# Patient Record
Sex: Female | Born: 1965 | Race: Black or African American | Hispanic: No | Marital: Married | Smoking: Never smoker
Health system: Southern US, Community
[De-identification: ages and names within clinical notes are randomized; demographics above are authoritative.]

## PROBLEM LIST (undated history)

## (undated) DIAGNOSIS — E119 Type 2 diabetes mellitus without complications: Secondary | ICD-10-CM

## (undated) DIAGNOSIS — J80 Acute respiratory distress syndrome: Secondary | ICD-10-CM

## (undated) DIAGNOSIS — U071 COVID-19: Secondary | ICD-10-CM

## (undated) DIAGNOSIS — J9621 Acute and chronic respiratory failure with hypoxia: Secondary | ICD-10-CM

## (undated) DIAGNOSIS — R6520 Severe sepsis without septic shock: Secondary | ICD-10-CM

## (undated) DIAGNOSIS — A419 Sepsis, unspecified organism: Secondary | ICD-10-CM

---

## 2019-04-10 ENCOUNTER — Inpatient Hospital Stay
Admission: RE | Admit: 2019-04-10 | Discharge: 2019-05-07 | Payer: BC Managed Care – PPO | Source: Other Acute Inpatient Hospital | Attending: Internal Medicine | Admitting: Internal Medicine

## 2019-04-10 ENCOUNTER — Other Ambulatory Visit (HOSPITAL_COMMUNITY): Payer: BC Managed Care – PPO

## 2019-04-10 ENCOUNTER — Ambulatory Visit (HOSPITAL_COMMUNITY)
Admission: AD | Admit: 2019-04-10 | Discharge: 2019-04-10 | Disposition: A | Discharge status: Home / Self Care | Payer: BC Managed Care – PPO | Source: Other Acute Inpatient Hospital | Attending: Internal Medicine | Admitting: Internal Medicine

## 2019-04-10 DIAGNOSIS — Z931 Gastrostomy status: Secondary | ICD-10-CM

## 2019-04-10 DIAGNOSIS — A419 Sepsis, unspecified organism: Secondary | ICD-10-CM | POA: Diagnosis present

## 2019-04-10 DIAGNOSIS — J80 Acute respiratory distress syndrome: Secondary | ICD-10-CM | POA: Diagnosis present

## 2019-04-10 DIAGNOSIS — J189 Pneumonia, unspecified organism: Secondary | ICD-10-CM

## 2019-04-10 DIAGNOSIS — Z9911 Dependence on respirator [ventilator] status: Secondary | ICD-10-CM

## 2019-04-10 DIAGNOSIS — U071 COVID-19: Secondary | ICD-10-CM | POA: Diagnosis present

## 2019-04-10 DIAGNOSIS — R6520 Severe sepsis without septic shock: Secondary | ICD-10-CM | POA: Diagnosis present

## 2019-04-10 DIAGNOSIS — J9690 Respiratory failure, unspecified, unspecified whether with hypoxia or hypercapnia: Secondary | ICD-10-CM

## 2019-04-10 DIAGNOSIS — J9621 Acute and chronic respiratory failure with hypoxia: Secondary | ICD-10-CM | POA: Diagnosis present

## 2019-04-10 HISTORY — DX: Acute respiratory distress syndrome: J80

## 2019-04-10 HISTORY — DX: Sepsis, unspecified organism: R65.20

## 2019-04-10 HISTORY — DX: Sepsis, unspecified organism: A41.9

## 2019-04-10 HISTORY — DX: COVID-19: U07.1

## 2019-04-10 HISTORY — DX: Acute and chronic respiratory failure with hypoxia: J96.21

## 2019-04-10 MED ORDER — IOHEXOL 300 MG/ML  SOLN
50.0000 mL | Freq: Once | INTRAMUSCULAR | Status: AC | PRN
  Administered 2019-04-10: 50 mL

## 2019-04-11 LAB — BLOOD GAS, ARTERIAL
Acid-Base Excess: 7.1 mmol/L — ABNORMAL HIGH (ref 0.0–2.0)
Bicarbonate: 31.5 mmol/L — ABNORMAL HIGH (ref 20.0–28.0)
FIO2: 50
O2 Saturation: 98.3 %
PEEP: 8 cmH2O
Patient temperature: 98.6
RATE: 24 resp/min
pCO2 arterial: 48.4 mmHg — ABNORMAL HIGH (ref 32.0–48.0)
pH, Arterial: 7.43 (ref 7.350–7.450)
pO2, Arterial: 110 mmHg — ABNORMAL HIGH (ref 83.0–108.0)

## 2019-04-11 LAB — CBC
HCT: 28.9 % — ABNORMAL LOW (ref 36.0–46.0)
Hemoglobin: 9.2 g/dL — ABNORMAL LOW (ref 12.0–15.0)
MCH: 29.1 pg (ref 26.0–34.0)
MCHC: 31.8 g/dL (ref 30.0–36.0)
MCV: 91.5 fL (ref 80.0–100.0)
Platelets: 371 10*3/uL (ref 150–400)
RBC: 3.16 MIL/uL — ABNORMAL LOW (ref 3.87–5.11)
RDW: 16.8 % — ABNORMAL HIGH (ref 11.5–15.5)
WBC: 14.5 10*3/uL — ABNORMAL HIGH (ref 4.0–10.5)
nRBC: 0.4 % — ABNORMAL HIGH (ref 0.0–0.2)

## 2019-04-11 LAB — URINALYSIS, ROUTINE W REFLEX MICROSCOPIC
Bilirubin Urine: NEGATIVE
Glucose, UA: NEGATIVE mg/dL
Hgb urine dipstick: NEGATIVE
Ketones, ur: NEGATIVE mg/dL
Nitrite: NEGATIVE
Protein, ur: NEGATIVE mg/dL
Specific Gravity, Urine: 1.011 (ref 1.005–1.030)
pH: 9 — ABNORMAL HIGH (ref 5.0–8.0)

## 2019-04-11 LAB — C DIFFICILE QUICK SCREEN W PCR REFLEX
C Diff antigen: NEGATIVE
C Diff interpretation: NOT DETECTED
C Diff toxin: NEGATIVE

## 2019-04-11 LAB — COMPREHENSIVE METABOLIC PANEL
ALT: 44 U/L (ref 0–44)
AST: 36 U/L (ref 15–41)
Albumin: 2.4 g/dL — ABNORMAL LOW (ref 3.5–5.0)
Alkaline Phosphatase: 90 U/L (ref 38–126)
Anion gap: 9 (ref 5–15)
BUN: 11 mg/dL (ref 6–20)
CO2: 29 mmol/L (ref 22–32)
Calcium: 8.3 mg/dL — ABNORMAL LOW (ref 8.9–10.3)
Chloride: 101 mmol/L (ref 98–111)
Creatinine, Ser: 0.43 mg/dL — ABNORMAL LOW (ref 0.44–1.00)
GFR calc Af Amer: >60 mL/min (ref >60)
GFR calc non Af Amer: >60 mL/min (ref >60)
Glucose, Bld: 160 mg/dL — ABNORMAL HIGH (ref 70–99)
Potassium: 4.5 mmol/L (ref 3.5–5.1)
Sodium: 139 mmol/L (ref 135–145)
Total Bilirubin: 1 mg/dL (ref 0.3–1.2)
Total Protein: 6 g/dL — ABNORMAL LOW (ref 6.5–8.1)

## 2019-04-11 LAB — TSH: TSH: 1.129 u[IU]/mL (ref 0.350–4.500)

## 2019-04-12 DIAGNOSIS — R6520 Severe sepsis without septic shock: Secondary | ICD-10-CM

## 2019-04-12 DIAGNOSIS — U071 COVID-19: Secondary | ICD-10-CM | POA: Diagnosis not present

## 2019-04-12 DIAGNOSIS — J9621 Acute and chronic respiratory failure with hypoxia: Secondary | ICD-10-CM | POA: Diagnosis not present

## 2019-04-12 DIAGNOSIS — J80 Acute respiratory distress syndrome: Secondary | ICD-10-CM

## 2019-04-12 DIAGNOSIS — A419 Sepsis, unspecified organism: Secondary | ICD-10-CM | POA: Diagnosis not present

## 2019-04-12 LAB — CBC
HCT: 28.4 % — ABNORMAL LOW (ref 36.0–46.0)
Hemoglobin: 8.7 g/dL — ABNORMAL LOW (ref 12.0–15.0)
MCH: 28.3 pg (ref 26.0–34.0)
MCHC: 30.6 g/dL (ref 30.0–36.0)
MCV: 92.5 fL (ref 80.0–100.0)
Platelets: 463 10*3/uL — ABNORMAL HIGH (ref 150–400)
RBC: 3.07 MIL/uL — ABNORMAL LOW (ref 3.87–5.11)
RDW: 16.8 % — ABNORMAL HIGH (ref 11.5–15.5)
WBC: 15.9 10*3/uL — ABNORMAL HIGH (ref 4.0–10.5)
nRBC: 0.4 % — ABNORMAL HIGH (ref 0.0–0.2)

## 2019-04-12 LAB — RENAL FUNCTION PANEL
Albumin: 2.5 g/dL — ABNORMAL LOW (ref 3.5–5.0)
Anion gap: 10 (ref 5–15)
BUN: 10 mg/dL (ref 6–20)
CO2: 29 mmol/L (ref 22–32)
Calcium: 8.4 mg/dL — ABNORMAL LOW (ref 8.9–10.3)
Chloride: 97 mmol/L — ABNORMAL LOW (ref 98–111)
Creatinine, Ser: 0.43 mg/dL — ABNORMAL LOW (ref 0.44–1.00)
GFR calc Af Amer: >60 mL/min (ref >60)
GFR calc non Af Amer: >60 mL/min (ref >60)
Glucose, Bld: 144 mg/dL — ABNORMAL HIGH (ref 70–99)
Phosphorus: 3.2 mg/dL (ref 2.5–4.6)
Potassium: 4.4 mmol/L (ref 3.5–5.1)
Sodium: 136 mmol/L (ref 135–145)

## 2019-04-12 LAB — T4, FREE: Free T4: 0.86 ng/dL (ref 0.61–1.12)

## 2019-04-12 LAB — FERRITIN: Ferritin: 766 ng/mL — ABNORMAL HIGH (ref 11–307)

## 2019-04-12 LAB — TSH: TSH: 2.437 u[IU]/mL (ref 0.350–4.500)

## 2019-04-12 LAB — MAGNESIUM: Magnesium: 1.7 mg/dL (ref 1.7–2.4)

## 2019-04-12 NOTE — Consult Note (Signed)
[facility]  Date of Service: 04/12/2019  PULMONARY CRITICAL CARE CONSULT   Pamela Beasley  ZOX:[id number]  DOB: 05/21/1966   DOA: 04/10/2019  Referring Physician: Carron CurieAli Hijazi, MD  HPI: Pamela DeitersMichelle Kelly Beasley is a 53 y.o. female seen for follow up of Acute on Chronic Respiratory Failure.  Patient has multiple medical problems including hypertension hypothyroidism gout depression anxiety morbid obesity presented to the hospital because of increasing shortness of breath.  Patient was noted to have bilateral pulmonary infiltrates at that time.  Was evaluated for COVID-19 is found to be positive for COVID-19.  Patient had quite a few complications including development of ARDS fungal pneumonia sepsis encephalopathy and acute renal failure which required dialysis.  Patient subsequently was not able to come off the ventilator and ended up having to have a tracheostomy.  Patient was transferred to our facility for further management and weaning.  Right now is comfortable and is on pressure support mode with  Review of Systems:  ROS performed and is unremarkable other than noted above.  Past medical history: Hypertension Diabetes type 2 Hypothyroidism Hyperlipidemia Anxiety Depression Gout Morbid obesity Anemia  Past surgical history: Tracheostomy  Family history: Noncontributory  Social history: Unknown if ever smoked or alcohol or drug abuse.  Allergies: Sulfa  Medications: Reviewed on Rounds  Physical Exam:  Vitals: Temperature 98.4 pulse 91 respiratory rate 35 blood pressure 160/98 saturations 96%  Ventilator Settings mode ventilation pressure support FiO2 40% pressure support 12/7  . General: Comfortable at this time . Eyes: Grossly normal lids, irises & conjunctiva . ENT: grossly tongue is normal . Neck: no obvious mass . Cardiovascular: S1-S2 normal no gallop or rub is noted . Respiratory:  No rhonchi coarse breath sounds are noted . Abdomen: Soft and nontender at this time . Skin: no rash seen on limited exam . Musculoskeletal: not rigid . Psychiatric:unable to assess . Neurologic: no seizure no involuntary movements         Labs on Admission:  Basic Metabolic Panel: Recent Labs  Lab 04/11/19 1132 04/12/19 0728  NA 139 136  K 4.5 4.4  CL 101 97*  CO2 29 29  GLUCOSE 160* 144*  BUN 11 10  CREATININE 0.43* 0.43*  CALCIUM 8.3* 8.4*  MG  --  1.7  PHOS  --  3.2    Recent Labs  Lab 04/11/19 0515  PHART 7.430  PCO2ART 48.4*  PO2ART 110*  HCO3 31.5*  O2SAT 98.3    Liver Function Tests: Recent Labs  Lab 04/11/19 1132 04/12/19 0728  AST 36  --   ALT 44  --   ALKPHOS 90  --   BILITOT 1.0  --   PROT 6.0*  --   ALBUMIN 2.4* 2.5*   No results for input(s): LIPASE, AMYLASE in the last 168 hours. No results for input(s): AMMONIA in the last 168 hours.  CBC: Recent Labs  Lab 04/11/19 1132 04/12/19 0728  WBC 14.5* 15.9*  HGB 9.2* 8.7*  HCT 28.9* 28.4*  MCV 91.5 92.5  PLT 371 463*    Cardiac Enzymes: No results for input(s): CKTOTAL, CKMB, CKMBINDEX, TROPONINI in the last 168 hours.  BNP (last 3 results) No results for input(s): BNP in the last 8760 hours.  ProBNP (last 3 results) No results for input(s): PROBNP in the last 8760 hours.   Radiological Exams on Admission: Dg Abdomen Peg Tube Location  Result Date: 04/10/2019 CLINICAL DATA:  PEG tube placement  EXAM: ABDOMEN - 1 VIEW COMPARISON:  None. FINDINGS: A percutaneous gastrostomy tube device is present in the left upper quadrant with an inflated balloon and gaseous distention of the stomach. The injected contrast media outlines the rugal folds of the gastric fundus. Interstitial airspace opacities are present throughout the imaged lung bases. Remaining bowel gas pattern is nonspecific. No acute osseous abnormality. IMPRESSION: Satisfactory positioning of the percutaneous gastrostomy tube.  Extensive interstitial and airspace disease in the lung bases. Electronically Signed   By: Lovena Le M.D.   On: 04/10/2019 22:42   Dg Chest Port 1 View  Result Date: 04/10/2019 CLINICAL DATA:  Respiratory failure EXAM: PORTABLE CHEST 1 VIEW COMPARISON:  None FINDINGS: Tracheostomy tube terminates in the mid trachea. Right upper extremity PICC terminates at the superior cavoatrial junction. The lung volumes are markedly diminished there is extensive interstitial and airspace opacity throughout both lungs with air bronchograms noted throughout the lung bases. No pneumothorax. Suspect at least small bilateral effusions. Cardiomediastinal contours are partially obscured by overlying opacity but appear grossly normal for the portable technique. No acute osseous or soft tissue abnormality. IMPRESSION: Findings concerning for multifocal pneumonia. Electronically Signed   By: Lovena Le M.D.   On: 04/10/2019 22:43    Assessment/Plan Active Problems:   Acute on chronic respiratory failure with hypoxia (HCC)   COVID-19 virus infection   Acute respiratory distress syndrome (ARDS) due to 2019 novel coronavirus   Severe sepsis ([place])   1. Acute on chronic respiratory failure with hypoxia patient is weaning on pressure support mode the goal today is for 4 hours.  We will continue to advance the wean on pressure support as tolerated titrate oxygen continue pulmonary toilet. 2. COVID-19 virus infection patient is now in resolution phase we will continue to follow along follow-up radiologically as deemed necessary. 3. Severe sepsis hemodynamics are stable we will continue with supportive care. 4. ARDS last chest x-ray still showing some areas of multifocal pneumonia we will continue to monitor radiologically  I have personally seen and evaluated the patient, evaluated laboratory and imaging results, formulated the assessment and plan and placed orders. The Patient requires high complexity decision making for  assessment and support.  Case was discussed on Rounds with the Respiratory Therapy Staff Time Spent 40minutes  Allyne Gee, MD [facility] Pulmonary Critical Care Medicine Sleep Medicine

## 2019-04-13 ENCOUNTER — Other Ambulatory Visit (HOSPITAL_COMMUNITY): Payer: BC Managed Care – PPO

## 2019-04-13 LAB — CBC
HCT: 31.4 % — ABNORMAL LOW (ref 36.0–46.0)
Hemoglobin: 9.6 g/dL — ABNORMAL LOW (ref 12.0–15.0)
MCH: 28.2 pg (ref 26.0–34.0)
MCHC: 30.6 g/dL (ref 30.0–36.0)
MCV: 92.1 fL (ref 80.0–100.0)
Platelets: 486 10*3/uL — ABNORMAL HIGH (ref 150–400)
RBC: 3.41 MIL/uL — ABNORMAL LOW (ref 3.87–5.11)
RDW: 17.5 % — ABNORMAL HIGH (ref 11.5–15.5)
WBC: 14.9 10*3/uL — ABNORMAL HIGH (ref 4.0–10.5)
nRBC: 0.7 % — ABNORMAL HIGH (ref 0.0–0.2)

## 2019-04-13 LAB — BLOOD GAS, ARTERIAL
Acid-Base Excess: 6.6 mmol/L — ABNORMAL HIGH (ref 0.0–2.0)
Bicarbonate: 31.1 mmol/L — ABNORMAL HIGH (ref 20.0–28.0)
FIO2: 40
O2 Saturation: 93.6 %
PEEP: 7 cmH2O
Patient temperature: 98.6
Pressure control: 18 cmH2O
RATE: 24 resp/min
pCO2 arterial: 48.3 mmHg — ABNORMAL HIGH (ref 32.0–48.0)
pH, Arterial: 7.424 (ref 7.350–7.450)
pO2, Arterial: 69.3 mmHg — ABNORMAL LOW (ref 83.0–108.0)

## 2019-04-13 LAB — RENAL FUNCTION PANEL
Albumin: 2.7 g/dL — ABNORMAL LOW (ref 3.5–5.0)
Anion gap: 9 (ref 5–15)
BUN: 15 mg/dL (ref 6–20)
CO2: 29 mmol/L (ref 22–32)
Calcium: 8.7 mg/dL — ABNORMAL LOW (ref 8.9–10.3)
Chloride: 100 mmol/L (ref 98–111)
Creatinine, Ser: 0.46 mg/dL (ref 0.44–1.00)
GFR calc Af Amer: >60 mL/min (ref >60)
GFR calc non Af Amer: >60 mL/min (ref >60)
Glucose, Bld: 135 mg/dL — ABNORMAL HIGH (ref 70–99)
Phosphorus: 3.5 mg/dL (ref 2.5–4.6)
Potassium: 4.2 mmol/L (ref 3.5–5.1)
Sodium: 138 mmol/L (ref 135–145)

## 2019-04-13 LAB — MAGNESIUM: Magnesium: 1.7 mg/dL (ref 1.7–2.4)

## 2019-04-14 DIAGNOSIS — J9621 Acute and chronic respiratory failure with hypoxia: Secondary | ICD-10-CM | POA: Diagnosis not present

## 2019-04-14 DIAGNOSIS — J80 Acute respiratory distress syndrome: Secondary | ICD-10-CM | POA: Diagnosis not present

## 2019-04-14 DIAGNOSIS — U071 COVID-19: Secondary | ICD-10-CM | POA: Diagnosis not present

## 2019-04-14 DIAGNOSIS — A419 Sepsis, unspecified organism: Secondary | ICD-10-CM | POA: Diagnosis not present

## 2019-04-14 LAB — URINE CULTURE: Culture: 10000 — AB

## 2019-04-14 NOTE — Progress Notes (Signed)
Pulmonary Critical Care Medicine [place]   PULMONARY CRITICAL CARE SERVICE  PROGRESS NOTE  Date of Service: 04/14/2019  Pamela Beasley  PIR:[id number]  DOB: Nov 25, 1965   DOA: 04/10/2019  Referring Physician: Merton Border, MD  HPI: Pamela Beasley is a 53 y.o. female seen for follow up of Acute on Chronic Respiratory Failure.  Patient currently is on full support and pressure control mode with inspiratory pressure of 18 has not been tolerating the weaning attempts.  Medications: Reviewed on Rounds  Physical Exam:  Vitals: Currently temperature is 97.9 pulse 91 respiratory rate 24 blood pressure 105/74  Ventilator Settings mode of ventilation pressure assist control FiO2 is 45% inspiratory pressure 18 with a PEEP of 7  . General: Comfortable at this time . Eyes: Grossly normal lids, irises & conjunctiva . ENT: grossly tongue is normal . Neck: no obvious mass . Cardiovascular: S1 S2 normal no gallop . Respiratory: No rhonchi no rales are noted at this time. . Abdomen: soft . Skin: no rash seen on limited exam . Musculoskeletal: not rigid . Psychiatric:unable to assess . Neurologic: no seizure no involuntary movements         Lab Data:   Basic Metabolic Panel: Recent Labs  Lab 04/11/19 1132 04/12/19 0728 04/13/19 0632  NA 139 136 138  K 4.5 4.4 4.2  CL 101 97* 100  CO2 29 29 29   GLUCOSE 160* 144* 135*  BUN 11 10 15   CREATININE 0.43* 0.43* 0.46  CALCIUM 8.3* 8.4* 8.7*  MG  --  1.7 1.7  PHOS  --  3.2 3.5    ABG: Recent Labs  Lab 04/11/19 0515 04/13/19 0516  PHART 7.430 7.424  PCO2ART 48.4* 48.3*  PO2ART 110* 69.3*  HCO3 31.5* 31.1*  O2SAT 98.3 93.6    Liver Function Tests: Recent Labs  Lab 04/11/19 1132 04/12/19 0728 04/13/19 0632  AST 36  --   --   ALT 44  --   --   ALKPHOS 90  --   --   BILITOT 1.0  --   --   PROT 6.0*  --   --   ALBUMIN 2.4* 2.5* 2.7*   No results for input(s): LIPASE, AMYLASE in the last  168 hours. No results for input(s): AMMONIA in the last 168 hours.  CBC: Recent Labs  Lab 04/11/19 1132 04/12/19 0728 04/13/19 0632  WBC 14.5* 15.9* 14.9*  HGB 9.2* 8.7* 9.6*  HCT 28.9* 28.4* 31.4*  MCV 91.5 92.5 92.1  PLT 371 463* 486*    Cardiac Enzymes: No results for input(s): CKTOTAL, CKMB, CKMBINDEX, TROPONINI in the last 168 hours.  BNP (last 3 results) No results for input(s): BNP in the last 8760 hours.  ProBNP (last 3 results) No results for input(s): PROBNP in the last 8760 hours.  Radiological Exams: Dg Chest Port 1 View  Result Date: 04/13/2019 CLINICAL DATA:  Respiratory failure, ventilation dependent EXAM: PORTABLE CHEST 1 VIEW COMPARISON:  Chest x-ray dated 04/10/2019. FINDINGS: Tracheostomy tube remains well positioned with tip overlying the upper trachea. RIGHT-sided PICC line is adequately positioned with tip at the level of the mid/upper SVC. Again noted are diffuse bilateral airspace opacities, predominantly interstitial, not significantly changed compared to the previous exam. No pleural effusion or pneumothorax seen. Osseous structures about the chest are unremarkable. IMPRESSION: 1. Stable chest x-ray. 2. Diffuse bilateral airspace opacities, predominantly interstitial, compatible with multifocal pneumonia and/or edema, not significantly changed compared to the previous exam. 3. Tracheostomy tube remains well  positioned. Electronically Signed   By: Bary RichardStan  Maynard M.D.   On: 04/13/2019 08:05    Assessment/Plan Active Problems:   Acute on chronic respiratory failure with hypoxia (HCC)   COVID-19 virus infection   Acute respiratory distress syndrome (ARDS) due to 2019 novel coronavirus   Severe sepsis (HCC)   1. Acute on chronic respiratory failure with hypoxia patient right now was not weaning was on full support and pressure control mode.  Will continue with the pulmonary toilet secretion management.  Continue to check the RSB I and try to wean as  tolerated 2. COVID-19 virus infection in resolution phase we will continue to follow 3. Acute respiratory distress syndrome slowly improving still has significant chest x-ray findings noted 4. Severe sepsis hemodynamics are stable we will continue to monitor.   I have personally seen and evaluated the patient, evaluated laboratory and imaging results, formulated the assessment and plan and placed orders. The Patient requires high complexity decision making for assessment and support.  Case was discussed on Rounds with the Respiratory Therapy Staff  Yevonne PaxSaadat A , MD [facility] Pulmonary Critical Care Medicine Sleep Medicine

## 2019-04-15 ENCOUNTER — Encounter: Payer: Self-pay | Admitting: Internal Medicine

## 2019-04-15 DIAGNOSIS — A419 Sepsis, unspecified organism: Secondary | ICD-10-CM | POA: Diagnosis present

## 2019-04-15 DIAGNOSIS — U071 COVID-19: Secondary | ICD-10-CM | POA: Diagnosis present

## 2019-04-15 DIAGNOSIS — J9621 Acute and chronic respiratory failure with hypoxia: Secondary | ICD-10-CM | POA: Diagnosis present

## 2019-04-15 DIAGNOSIS — J80 Acute respiratory distress syndrome: Secondary | ICD-10-CM | POA: Diagnosis not present

## 2019-04-15 DIAGNOSIS — R6520 Severe sepsis without septic shock: Secondary | ICD-10-CM | POA: Diagnosis present

## 2019-04-15 LAB — BASIC METABOLIC PANEL
Anion gap: 8 (ref 5–15)
BUN: 13 mg/dL (ref 6–20)
CO2: 30 mmol/L (ref 22–32)
Calcium: 8.9 mg/dL (ref 8.9–10.3)
Chloride: 102 mmol/L (ref 98–111)
Creatinine, Ser: 0.32 mg/dL — ABNORMAL LOW (ref 0.44–1.00)
GFR calc Af Amer: >60 mL/min (ref >60)
GFR calc non Af Amer: >60 mL/min (ref >60)
Glucose, Bld: 142 mg/dL — ABNORMAL HIGH (ref 70–99)
Potassium: 4.6 mmol/L (ref 3.5–5.1)
Sodium: 140 mmol/L (ref 135–145)

## 2019-04-15 LAB — CBC
HCT: 29.9 % — ABNORMAL LOW (ref 36.0–46.0)
Hemoglobin: 9.2 g/dL — ABNORMAL LOW (ref 12.0–15.0)
MCH: 28.6 pg (ref 26.0–34.0)
MCHC: 30.8 g/dL (ref 30.0–36.0)
MCV: 92.9 fL (ref 80.0–100.0)
Platelets: 488 10*3/uL — ABNORMAL HIGH (ref 150–400)
RBC: 3.22 MIL/uL — ABNORMAL LOW (ref 3.87–5.11)
RDW: 17.6 % — ABNORMAL HIGH (ref 11.5–15.5)
WBC: 14.5 10*3/uL — ABNORMAL HIGH (ref 4.0–10.5)
nRBC: 0.1 % (ref 0.0–0.2)

## 2019-04-15 LAB — MAGNESIUM: Magnesium: 1.7 mg/dL (ref 1.7–2.4)

## 2019-04-15 LAB — PHOSPHORUS: Phosphorus: 3.9 mg/dL (ref 2.5–4.6)

## 2019-04-15 LAB — VANCOMYCIN, TROUGH: Vancomycin Tr: 17 ug/mL (ref 15–20)

## 2019-04-15 NOTE — Progress Notes (Signed)
Pulmonary Critical Care Medicine [place]   PULMONARY CRITICAL CARE SERVICE  PROGRESS NOTE  Date of Service: 04/15/2019  Pamela Beasley  OJJ:[id number]  DOB: 25-Mar-1966   DOA: 04/10/2019  Referring Physician: Merton Border, MD  HPI: Pamela Beasley is a 53 y.o. female seen for follow up of Acute on Chronic Respiratory Failure.  Patient is still on the ventilator not weaning right now is on full support and pressure control mode has been on 40% FiO2  Medications: Reviewed on Rounds  Physical Exam:  Vitals: Temperature 98.6 pulse 95 respiratory 29 blood pressure 146/89 saturations 96%  Ventilator Settings mode ventilation pressure control FiO2 40% tidal volume 507 and start pressure 18 PEEP 7  . General: Comfortable at this time . Eyes: Grossly normal lids, irises & conjunctiva . ENT: grossly tongue is normal . Neck: no obvious mass . Cardiovascular: S1 S2 normal no gallop . Respiratory: No rhonchi coarse breath sounds are noted . Abdomen: soft . Skin: no rash seen on limited exam . Musculoskeletal: not rigid . Psychiatric:unable to assess . Neurologic: no seizure no involuntary movements         Lab Data:   Basic Metabolic Panel: Recent Labs  Lab 04/11/19 1132 04/12/19 0728 04/13/19 0632  NA 139 136 138  K 4.5 4.4 4.2  CL 101 97* 100  CO2 29 29 29   GLUCOSE 160* 144* 135*  BUN 11 10 15   CREATININE 0.43* 0.43* 0.46  CALCIUM 8.3* 8.4* 8.7*  MG  --  1.7 1.7  PHOS  --  3.2 3.5    ABG: Recent Labs  Lab 04/11/19 0515 04/13/19 0516  PHART 7.430 7.424  PCO2ART 48.4* 48.3*  PO2ART 110* 69.3*  HCO3 31.5* 31.1*  O2SAT 98.3 93.6    Liver Function Tests: Recent Labs  Lab 04/11/19 1132 04/12/19 0728 04/13/19 0632  AST 36  --   --   ALT 44  --   --   ALKPHOS 90  --   --   BILITOT 1.0  --   --   PROT 6.0*  --   --   ALBUMIN 2.4* 2.5* 2.7*   No results for input(s): LIPASE, AMYLASE in the last 168 hours. No results for  input(s): AMMONIA in the last 168 hours.  CBC: Recent Labs  Lab 04/11/19 1132 04/12/19 0728 04/13/19 0632  WBC 14.5* 15.9* 14.9*  HGB 9.2* 8.7* 9.6*  HCT 28.9* 28.4* 31.4*  MCV 91.5 92.5 92.1  PLT 371 463* 486*    Cardiac Enzymes: No results for input(s): CKTOTAL, CKMB, CKMBINDEX, TROPONINI in the last 168 hours.  BNP (last 3 results) No results for input(s): BNP in the last 8760 hours.  ProBNP (last 3 results) No results for input(s): PROBNP in the last 8760 hours.  Radiological Exams: No results found.  Assessment/Plan Active Problems:   Acute on chronic respiratory failure with hypoxia (HCC)   COVID-19 virus infection   Acute respiratory distress syndrome (ARDS) due to 2019 novel coronavirus   Severe sepsis ([place])   1. Acute on chronic respiratory failure with hypoxia we will continue to assess the RSB I today.  Titrate oxygen as tolerated. 2. COVID-19 virus infection in resolution phase slowly improving we will continue to follow along. 3. Acute respiratory distress syndrome we will continue with present management 4. Severe sepsis hemodynamics are stable we will continue with present management   I have personally seen and evaluated the patient, evaluated laboratory and imaging results, formulated the assessment  and plan and placed orders. The Patient requires high complexity decision making for assessment and support.  Case was discussed on Rounds with the Respiratory Therapy Staff  Allyne Gee, MD [facility] Pulmonary Critical Care Medicine Sleep Medicine

## 2019-04-16 DIAGNOSIS — U071 COVID-19: Secondary | ICD-10-CM | POA: Diagnosis not present

## 2019-04-16 DIAGNOSIS — A419 Sepsis, unspecified organism: Secondary | ICD-10-CM | POA: Diagnosis not present

## 2019-04-16 DIAGNOSIS — J80 Acute respiratory distress syndrome: Secondary | ICD-10-CM | POA: Diagnosis not present

## 2019-04-16 DIAGNOSIS — J9621 Acute and chronic respiratory failure with hypoxia: Secondary | ICD-10-CM | POA: Diagnosis not present

## 2019-04-16 LAB — CULTURE, RESPIRATORY W GRAM STAIN

## 2019-04-16 LAB — MAGNESIUM: Magnesium: 1.8 mg/dL (ref 1.7–2.4)

## 2019-04-16 NOTE — Progress Notes (Signed)
Pulmonary Critical Care Medicine [facility] [place]   PULMONARY CRITICAL CARE SERVICE  PROGRESS NOTE  Date of Service: 04/16/2019  Avry Monteleone  SEG:[id number]  DOB: Oct 23, 1965   DOA: 04/10/2019  Referring Physician: Carron Curie, MD  HPI: Desree Leap is a 53 y.o. female seen for follow up of Acute on Chronic Respiratory Failure.  Patient currently is on pressure support mode has been on 40% FiO2 with a goal of 8 hours  Medications: Reviewed on Rounds  Physical Exam:  Vitals: Temperature 98.3 pulse 93 respiratory rate 30 blood pressure is 140/82 saturations 98%  Ventilator Settings mode ventilation pressure support FiO2 40% pressure support 12 PEEP 7  . General: Comfortable at this time . Eyes: Grossly normal lids, irises & conjunctiva . ENT: grossly tongue is normal . Neck: no obvious mass . Cardiovascular: S1 S2 normal no gallop . Respiratory: Scattered rhonchi noted bilaterally . Abdomen: soft . Skin: no rash seen on limited exam . Musculoskeletal: not rigid . Psychiatric:unable to assess . Neurologic: no seizure no involuntary movements         Lab Data:   Basic Metabolic Panel: Recent Labs  Lab 04/11/19 1132 04/12/19 0728 04/13/19 0632 04/15/19 1019 04/16/19 0532  NA 139 136 138 140  --   K 4.5 4.4 4.2 4.6  --   CL 101 97* 100 102  --   CO2 29 29 29 30   --   GLUCOSE 160* 144* 135* 142*  --   BUN 11 10 15 13   --   CREATININE 0.43* 0.43* 0.46 0.32*  --   CALCIUM 8.3* 8.4* 8.7* 8.9  --   MG  --  1.7 1.7 1.7 1.8  PHOS  --  3.2 3.5 3.9  --     ABG: Recent Labs  Lab 04/11/19 0515 04/13/19 0516  PHART 7.430 7.424  PCO2ART 48.4* 48.3*  PO2ART 110* 69.3*  HCO3 31.5* 31.1*  O2SAT 98.3 93.6    Liver Function Tests: Recent Labs  Lab 04/11/19 1132 04/12/19 0728 04/13/19 0632  AST 36  --   --   ALT 44  --   --   ALKPHOS 90  --   --   BILITOT 1.0  --   --   PROT 6.0*  --   --   ALBUMIN 2.4* 2.5* 2.7*   No results  for input(s): LIPASE, AMYLASE in the last 168 hours. No results for input(s): AMMONIA in the last 168 hours.  CBC: Recent Labs  Lab 04/11/19 1132 04/12/19 0728 04/13/19 0632 04/15/19 1019  WBC 14.5* 15.9* 14.9* 14.5*  HGB 9.2* 8.7* 9.6* 9.2*  HCT 28.9* 28.4* 31.4* 29.9*  MCV 91.5 92.5 92.1 92.9  PLT 371 463* 486* 488*    Cardiac Enzymes: No results for input(s): CKTOTAL, CKMB, CKMBINDEX, TROPONINI in the last 168 hours.  BNP (last 3 results) No results for input(s): BNP in the last 8760 hours.  ProBNP (last 3 results) No results for input(s): PROBNP in the last 8760 hours.  Radiological Exams: No results found.  Assessment/Plan Active Problems:   Acute on chronic respiratory failure with hypoxia (HCC)   COVID-19 virus infection   Acute respiratory distress syndrome (ARDS) due to 2019 novel coronavirus   Severe sepsis (HCC)   1. Acute on chronic respiratory failure with hypoxia we will continue with pressure support mode patient is on 40% FiO2 is already noted above the goal is for 8 hours 2. COVID-19 virus infection patient is unchanged in resolution  phase 3. Acute respiratory distress improving patient's oxygen requirements are still 40% however we will continue to follow 4. Severe sepsis resolved   I have personally seen and evaluated the patient, evaluated laboratory and imaging results, formulated the assessment and plan and placed orders. The Patient requires high complexity decision making for assessment and support.  Case was discussed on Rounds with the Respiratory Therapy Staff  Allyne Gee, MD [facility] Pulmonary Critical Care Medicine Sleep Medicine

## 2019-04-17 ENCOUNTER — Other Ambulatory Visit (HOSPITAL_COMMUNITY): Payer: BC Managed Care – PPO

## 2019-04-17 DIAGNOSIS — U071 COVID-19: Secondary | ICD-10-CM | POA: Diagnosis not present

## 2019-04-17 DIAGNOSIS — A419 Sepsis, unspecified organism: Secondary | ICD-10-CM | POA: Diagnosis not present

## 2019-04-17 DIAGNOSIS — J80 Acute respiratory distress syndrome: Secondary | ICD-10-CM | POA: Diagnosis not present

## 2019-04-17 DIAGNOSIS — J9621 Acute and chronic respiratory failure with hypoxia: Secondary | ICD-10-CM | POA: Diagnosis not present

## 2019-04-17 NOTE — Progress Notes (Signed)
Pulmonary Critical Care Medicine [place]   PULMONARY CRITICAL CARE SERVICE  PROGRESS NOTE  Date of Service: 04/17/2019  Pamela Beasley  QQP:[id number]  DOB: 1965-10-01   DOA: 04/10/2019  Referring Physician: Merton Border, MD  HPI: Pamela Beasley is a 53 y.o. female seen for follow up of Acute on Chronic Respiratory Failure.  Patient currently is on pressure support mode with a goal of 16 hours looks good this morning.  Medications: Reviewed on Rounds  Physical Exam:  Vitals: Temperature 98.7 pulse 97 respiratory 26 blood pressure 155/83 saturations 98%  Ventilator Settings mode ventilation pressure support FiO2 is 40% pressure 12 PEEP 7  . General: Comfortable at this time . Eyes: Grossly normal lids, irises & conjunctiva . ENT: grossly tongue is normal . Neck: no obvious mass . Cardiovascular: S1 S2 normal no gallop . Respiratory: No rhonchi coarse breath sounds are noted . Abdomen: soft . Skin: no rash seen on limited exam . Musculoskeletal: not rigid . Psychiatric:unable to assess . Neurologic: no seizure no involuntary movements         Lab Data:   Basic Metabolic Panel: Recent Labs  Lab 04/11/19 1132 04/12/19 0728 04/13/19 0632 04/15/19 1019 04/16/19 0532  NA 139 136 138 140  --   K 4.5 4.4 4.2 4.6  --   CL 101 97* 100 102  --   CO2 29 29 29 30   --   GLUCOSE 160* 144* 135* 142*  --   BUN 11 10 15 13   --   CREATININE 0.43* 0.43* 0.46 0.32*  --   CALCIUM 8.3* 8.4* 8.7* 8.9  --   MG  --  1.7 1.7 1.7 1.8  PHOS  --  3.2 3.5 3.9  --     ABG: Recent Labs  Lab 04/11/19 0515 04/13/19 0516  PHART 7.430 7.424  PCO2ART 48.4* 48.3*  PO2ART 110* 69.3*  HCO3 31.5* 31.1*  O2SAT 98.3 93.6    Liver Function Tests: Recent Labs  Lab 04/11/19 1132 04/12/19 0728 04/13/19 0632  AST 36  --   --   ALT 44  --   --   ALKPHOS 90  --   --   BILITOT 1.0  --   --   PROT 6.0*  --   --   ALBUMIN 2.4* 2.5* 2.7*   No results for  input(s): LIPASE, AMYLASE in the last 168 hours. No results for input(s): AMMONIA in the last 168 hours.  CBC: Recent Labs  Lab 04/11/19 1132 04/12/19 0728 04/13/19 0632 04/15/19 1019  WBC 14.5* 15.9* 14.9* 14.5*  HGB 9.2* 8.7* 9.6* 9.2*  HCT 28.9* 28.4* 31.4* 29.9*  MCV 91.5 92.5 92.1 92.9  PLT 371 463* 486* 488*    Cardiac Enzymes: No results for input(s): CKTOTAL, CKMB, CKMBINDEX, TROPONINI in the last 168 hours.  BNP (last 3 results) No results for input(s): BNP in the last 8760 hours.  ProBNP (last 3 results) No results for input(s): PROBNP in the last 8760 hours.  Radiological Exams: No results found.  Assessment/Plan Active Problems:   Acute on chronic respiratory failure with hypoxia (HCC)   COVID-19 virus infection   Acute respiratory distress syndrome (ARDS) due to 2019 novel coronavirus   Severe sepsis ([place])   1. Acute on chronic respiratory failure with hypoxia we will continue with pressure support mode patient's goal today is 16 hours 2. COVID-19 virus infection in resolution phase 3. ARDS treated improving 4. Severe sepsis hemodynamics are stable  I have personally seen and evaluated the patient, evaluated laboratory and imaging results, formulated the assessment and plan and placed orders. The Patient requires high complexity decision making for assessment and support.  Case was discussed on Rounds with the Respiratory Therapy Staff  Allyne Gee, MD [facility] Pulmonary Critical Care Medicine Sleep Medicine

## 2019-04-18 DIAGNOSIS — U071 COVID-19: Secondary | ICD-10-CM | POA: Diagnosis not present

## 2019-04-18 DIAGNOSIS — J80 Acute respiratory distress syndrome: Secondary | ICD-10-CM | POA: Diagnosis not present

## 2019-04-18 DIAGNOSIS — J9621 Acute and chronic respiratory failure with hypoxia: Secondary | ICD-10-CM | POA: Diagnosis not present

## 2019-04-18 DIAGNOSIS — A419 Sepsis, unspecified organism: Secondary | ICD-10-CM | POA: Diagnosis not present

## 2019-04-18 LAB — CBC
HCT: 31.6 % — ABNORMAL LOW (ref 36.0–46.0)
Hemoglobin: 9.6 g/dL — ABNORMAL LOW (ref 12.0–15.0)
MCH: 28.2 pg (ref 26.0–34.0)
MCHC: 30.4 g/dL (ref 30.0–36.0)
MCV: 92.9 fL (ref 80.0–100.0)
Platelets: 430 10*3/uL — ABNORMAL HIGH (ref 150–400)
RBC: 3.4 MIL/uL — ABNORMAL LOW (ref 3.87–5.11)
RDW: 18.2 % — ABNORMAL HIGH (ref 11.5–15.5)
WBC: 9.5 10*3/uL (ref 4.0–10.5)
nRBC: 0 % (ref 0.0–0.2)

## 2019-04-18 LAB — BASIC METABOLIC PANEL
Anion gap: 11 (ref 5–15)
BUN: 14 mg/dL (ref 6–20)
CO2: 28 mmol/L (ref 22–32)
Calcium: 9 mg/dL (ref 8.9–10.3)
Chloride: 99 mmol/L (ref 98–111)
Creatinine, Ser: 0.5 mg/dL (ref 0.44–1.00)
GFR calc Af Amer: >60 mL/min (ref >60)
GFR calc non Af Amer: >60 mL/min (ref >60)
Glucose, Bld: 166 mg/dL — ABNORMAL HIGH (ref 70–99)
Potassium: 4.1 mmol/L (ref 3.5–5.1)
Sodium: 138 mmol/L (ref 135–145)

## 2019-04-18 LAB — MAGNESIUM: Magnesium: 1.7 mg/dL (ref 1.7–2.4)

## 2019-04-18 NOTE — Progress Notes (Signed)
Pulmonary Critical Care Medicine [facility] [place]   PULMONARY CRITICAL CARE SERVICE  PROGRESS NOTE  Date of Service: 04/18/2019  Pamela Beasley  PHX:[id number]  DOB: [birth year]   DOA: 04/10/2019  Referring Physician: Carron Curie, MD  HPI: Pamela Beasley is a 53 y.o. female seen for follow up of Acute on Chronic Respiratory Failure.  Patient currently is on nag device on 6 L oxygen  Medications: Reviewed on Rounds  Physical Exam:  Vitals: Temperature 98.7 pulse 95 respiratory rate 30 blood pressure 135/80 saturations 98%  Ventilator Settings off the ventilator right now on a neck device requiring 6 L  . General: Comfortable at this time . Eyes: Grossly normal lids, irises & conjunctiva . ENT: grossly tongue is normal . Neck: no obvious mass . Cardiovascular: S1 S2 normal no gallop . Respiratory: No rhonchi no rales are noted at this time . Abdomen: soft . Skin: no rash seen on limited exam . Musculoskeletal: not rigid . Psychiatric:unable to assess . Neurologic: no seizure no involuntary movements         Lab Data:   Basic Metabolic Panel: Recent Labs  Lab 04/12/19 0728 04/13/19 0632 04/15/19 1019 04/16/19 0532 04/18/19 0610  NA 136 138 140  --  138  K 4.4 4.2 4.6  --  4.1  CL 97* 100 102  --  99  CO2 29 29 30   --  28  GLUCOSE 144* 135* 142*  --  166*  BUN 10 15 13   --  14  CREATININE 0.43* 0.46 0.32*  --  0.50  CALCIUM 8.4* 8.7* 8.9  --  9.0  MG 1.7 1.7 1.7 1.8 1.7  PHOS 3.2 3.5 3.9  --   --     ABG: Recent Labs  Lab 04/13/19 0516  PHART 7.424  PCO2ART 48.3*  PO2ART 69.3*  HCO3 31.1*  O2SAT 93.6    Liver Function Tests: Recent Labs  Lab 04/12/19 0728 04/13/19 0632  ALBUMIN 2.5* 2.7*   No results for input(s): LIPASE, AMYLASE in the last 168 hours. No results for input(s): AMMONIA in the last 168 hours.  CBC: Recent Labs  Lab 04/12/19 0728 04/13/19 0632 04/15/19 1019 04/18/19 0610  WBC 15.9* 14.9* 14.5*  9.5  HGB 8.7* 9.6* 9.2* 9.6*  HCT 28.4* 31.4* 29.9* 31.6*  MCV 92.5 92.1 92.9 92.9  PLT 463* 486* 488* 430*    Cardiac Enzymes: No results for input(s): CKTOTAL, CKMB, CKMBINDEX, TROPONINI in the last 168 hours.  BNP (last 3 results) No results for input(s): BNP in the last 8760 hours.  ProBNP (last 3 results) No results for input(s): PROBNP in the last 8760 hours.  Radiological Exams: Dg Chest Port 1 View  Result Date: 04/17/2019 CLINICAL DATA:  Pneumonia EXAM: PORTABLE CHEST 1 VIEW COMPARISON:  04/13/2019 FINDINGS: The right-sided PICC line is well position. The tracheostomy tube is unchanged. Multifocal airspace opacities are again noted, greatest at the lung bases. There is no pneumothorax. The heart size is stable. There is no acute osseous abnormality. IMPRESSION: 1. Lines and tubes as above. 2. Stable appearance of the chest. Persistent multifocal airspace opacities, similar to prior study. Electronically Signed   By: 04/19/2019 M.D.   On: 04/17/2019 14:42    Assessment/Plan Active Problems:   Acute on chronic respiratory failure with hypoxia (HCC)   COVID-19 virus infection   Acute respiratory distress syndrome (ARDS) due to 2019 novel coronavirus   Severe sepsis (HCC)   1. Acute on chronic respiratory  failure with hypoxia we will continue with the neck device patient is going to be on a goal for 2 hours 2. COVID-19 virus infection at baseline we will continue secretion management supportive care 3. ARDS treated we will continue present management 4. Severe sepsis hemodynamics are stable   I have personally seen and evaluated the patient, evaluated laboratory and imaging results, formulated the assessment and plan and placed orders. The Patient requires high complexity decision making for assessment and support.  Case was discussed on Rounds with the Respiratory Therapy Staff  Allyne Gee, MD [facility] Pulmonary Critical Care Medicine Sleep Medicine

## 2019-04-19 DIAGNOSIS — J9621 Acute and chronic respiratory failure with hypoxia: Secondary | ICD-10-CM | POA: Diagnosis not present

## 2019-04-19 DIAGNOSIS — A419 Sepsis, unspecified organism: Secondary | ICD-10-CM | POA: Diagnosis not present

## 2019-04-19 DIAGNOSIS — J80 Acute respiratory distress syndrome: Secondary | ICD-10-CM | POA: Diagnosis not present

## 2019-04-19 DIAGNOSIS — U071 COVID-19: Secondary | ICD-10-CM | POA: Diagnosis not present

## 2019-04-19 LAB — VANCOMYCIN, TROUGH: Vancomycin Tr: 21 ug/mL (ref 15–20)

## 2019-04-19 LAB — MAGNESIUM: Magnesium: 1.8 mg/dL (ref 1.7–2.4)

## 2019-04-19 NOTE — Progress Notes (Addendum)
Pulmonary [place]   PULMONARY CRITICAL CARE SERVICE  PROGRESS NOTE  Date of Service: 04/19/2019  Pamela Beasley  ZJI:[id number]  DOB: Jan 14, 1966   DOA: 04/10/2019  Referring Physician: Merton Border, MD  HPI: Pamela Beasley is a 53 y.o. female seen for follow up of Acute on Chronic Respiratory Failure.  Patient is on 9 today 5 L for 4 to 6-hour goal normal respiratory pressure support 12/5 with an FiO2 of 35%.  Medications: Reviewed on Rounds  Physical Exam:  Vitals: Pulse 80 respirations 13 BP 140/66 O2 sat 98% temp 98.7  Ventilator Settings nag 5 L  . General: Comfortable at this time . Eyes: Grossly normal lids, irises & conjunctiva . ENT: grossly tongue is normal . Neck: no obvious mass . Cardiovascular: S1 S2 normal no gallop . Respiratory: No rales or rhonchi noted . Abdomen: soft . Skin: no rash seen on limited exam . Musculoskeletal: not rigid . Psychiatric:unable to assess . Neurologic: no seizure no involuntary movements         Lab Data:   Basic Metabolic Panel: Recent Labs  Lab 04/13/19 0632 04/15/19 1019 04/16/19 0532 04/18/19 0610 04/19/19 0600  NA 138 140  --  138  --   K 4.2 4.6  --  4.1  --   CL 100 102  --  99  --   CO2 29 30  --  28  --   GLUCOSE 135* 142*  --  166*  --   BUN 15 13  --  14  --   CREATININE 0.46 0.32*  --  0.50  --   CALCIUM 8.7* 8.9  --  9.0  --   MG 1.7 1.7 1.8 1.7 1.8  PHOS 3.5 3.9  --   --   --     ABG: Recent Labs  Lab 04/13/19 0516  PHART 7.424  PCO2ART 48.3*  PO2ART 69.3*  HCO3 31.1*  O2SAT 93.6    Liver Function Tests: Recent Labs  Lab 04/13/19 0632  ALBUMIN 2.7*   No results for input(s): LIPASE, AMYLASE in the last 168 hours. No results for input(s): AMMONIA in the last 168 hours.  CBC: Recent Labs  Lab 04/13/19 0632 04/15/19 1019 04/18/19 0610  WBC 14.9* 14.5* 9.5  HGB 9.6* 9.2* 9.6*  HCT 31.4* 29.9* 31.6*  MCV 92.1 92.9 92.9   PLT 486* 488* 430*    Cardiac Enzymes: No results for input(s): CKTOTAL, CKMB, CKMBINDEX, TROPONINI in the last 168 hours.  BNP (last 3 results) No results for input(s): BNP in the last 8760 hours.  ProBNP (last 3 results) No results for input(s): PROBNP in the last 8760 hours.  Radiological Exams: Dg Chest Port 1 View  Result Date: 04/17/2019 CLINICAL DATA:  Pneumonia EXAM: PORTABLE CHEST 1 VIEW COMPARISON:  04/13/2019 FINDINGS: The right-sided PICC line is well position. The tracheostomy tube is unchanged. Multifocal airspace opacities are again noted, greatest at the lung bases. There is no pneumothorax. The heart size is stable. There is no acute osseous abnormality. IMPRESSION: 1. Lines and tubes as above. 2. Stable appearance of the chest. Persistent multifocal airspace opacities, similar to prior study. Electronically Signed   By: Constance Holster M.D.   On: 04/17/2019 14:42    Assessment/Plan Active Problems:   Acute on chronic respiratory failure with hypoxia (HCC)   COVID-19 virus infection   Acute respiratory distress syndrome (ARDS) due to 2019 novel coronavirus   Severe sepsis ([place])  1. Acute on chronic respiratory failure with hypoxia we will continue with the nag device patient is going to be on a goal for 4-6 hours 2. COVID-19 virus infection at baseline we will continue secretion management supportive care 3. ARDS treated we will continue present management 4. Severe sepsis hemodynamics are stable   I have personally seen and evaluated the patient, evaluated laboratory and imaging results, formulated the assessment and plan and placed orders. The Patient requires high complexity decision making for assessment and support.  Case was discussed on Rounds with the Respiratory Therapy Staff  Yevonne Pax, MD [facility] Pulmonary Critical Care Medicine Sleep Medicine

## 2019-04-20 DIAGNOSIS — A419 Sepsis, unspecified organism: Secondary | ICD-10-CM | POA: Diagnosis not present

## 2019-04-20 DIAGNOSIS — J9621 Acute and chronic respiratory failure with hypoxia: Secondary | ICD-10-CM | POA: Diagnosis not present

## 2019-04-20 DIAGNOSIS — U071 COVID-19: Secondary | ICD-10-CM | POA: Diagnosis not present

## 2019-04-20 DIAGNOSIS — J80 Acute respiratory distress syndrome: Secondary | ICD-10-CM | POA: Diagnosis not present

## 2019-04-20 NOTE — Progress Notes (Addendum)
Pulmonary [place]   PULMONARY CRITICAL CARE SERVICE  PROGRESS NOTE  Date of Service: 04/20/2019  Pamela Beasley  CNO:[id number]  DOB: [birth year]   DOA: 04/10/2019  Referring Physician: Merton Border, MD  HPI: Pamela Beasley is a 53 y.o. female seen for follow up of Acute on Chronic Respiratory Failure.  Patient has a 4-hour goal on 4 L/min.  Medications: Reviewed on Rounds  Physical Exam:  Vitals: Pulse 91 respirations 22 BP 145/84 O2 sat 100% temp 98.1  Ventilator Settings nag 4 L/min  . General: Comfortable at this time . Eyes: Grossly normal lids, irises & conjunctiva . ENT: grossly tongue is normal . Neck: no obvious mass . Cardiovascular: S1 S2 normal no gallop . Respiratory: No rales or rhonchi noted . Abdomen: soft . Skin: no rash seen on limited exam . Musculoskeletal: not rigid . Psychiatric:unable to assess . Neurologic: no seizure no involuntary movements         Lab Data:   Basic Metabolic Panel: Recent Labs  Lab 04/15/19 1019 04/16/19 0532 04/18/19 0610 04/19/19 0600  NA 140  --  138  --   K 4.6  --  4.1  --   CL 102  --  99  --   CO2 30  --  28  --   GLUCOSE 142*  --  166*  --   BUN 13  --  14  --   CREATININE 0.32*  --  0.50  --   CALCIUM 8.9  --  9.0  --   MG 1.7 1.8 1.7 1.8  PHOS 3.9  --   --   --     ABG: No results for input(s): PHART, PCO2ART, PO2ART, HCO3, O2SAT in the last 168 hours.  Liver Function Tests: No results for input(s): AST, ALT, ALKPHOS, BILITOT, PROT, ALBUMIN in the last 168 hours. No results for input(s): LIPASE, AMYLASE in the last 168 hours. No results for input(s): AMMONIA in the last 168 hours.  CBC: Recent Labs  Lab 04/15/19 1019 04/18/19 0610  WBC 14.5* 9.5  HGB 9.2* 9.6*  HCT 29.9* 31.6*  MCV 92.9 92.9  PLT 488* 430*    Cardiac Enzymes: No results for input(s): CKTOTAL, CKMB, CKMBINDEX, TROPONINI in the last 168 hours.  BNP (last 3  results) No results for input(s): BNP in the last 8760 hours.  ProBNP (last 3 results) No results for input(s): PROBNP in the last 8760 hours.  Radiological Exams: No results found.  Assessment/Plan Active Problems:   Acute on chronic respiratory failure with hypoxia (HCC)   COVID-19 virus infection   Acute respiratory distress syndrome (ARDS) due to 2019 novel coronavirus   Severe sepsis ([place])   1. Acute on chronic respiratory failure with hypoxia we will continue with the nag device patient is going to be on a goal for 4-6 hours 2. COVID-19 virus infection at baseline we will continue secretion management supportive care 3. ARDS treated we will continue present management 4. Severe sepsis hemodynamics are stable   I have personally seen and evaluated the patient, evaluated laboratory and imaging results, formulated the assessment and plan and placed orders. The Patient requires high complexity decision making for assessment and support.  Case was discussed on Rounds with the Respiratory Therapy Staff  Allyne Gee, MD [facility] Pulmonary Critical Care Medicine Sleep Medicine

## 2019-04-21 DIAGNOSIS — J80 Acute respiratory distress syndrome: Secondary | ICD-10-CM | POA: Diagnosis not present

## 2019-04-21 DIAGNOSIS — U071 COVID-19: Secondary | ICD-10-CM | POA: Diagnosis not present

## 2019-04-21 DIAGNOSIS — A419 Sepsis, unspecified organism: Secondary | ICD-10-CM | POA: Diagnosis not present

## 2019-04-21 DIAGNOSIS — J9621 Acute and chronic respiratory failure with hypoxia: Secondary | ICD-10-CM | POA: Diagnosis not present

## 2019-04-21 LAB — VANCOMYCIN, TROUGH: Vancomycin Tr: 4 ug/mL — ABNORMAL LOW (ref 15–20)

## 2019-04-21 NOTE — Progress Notes (Addendum)
Pulmonary Critical Care Medicine [place]   PULMONARY CRITICAL CARE SERVICE  PROGRESS NOTE  Date of Service: 04/21/2019  Pamela Beasley  LKG:[id number]  DOB: Dec 01, 1965   DOA: 04/10/2019  Referring Physician: Merton Border, MD  HPI: Pamela Beasley is a 53 y.o. female seen for follow up of Acute on Chronic Respiratory Failure.  Patient remains on nag device at 3 L/min for goal of 6 to 8 hours today.  Medications: Reviewed on Rounds  Physical Exam:  Vitals: Pulse 99 respirations 18 BP 120/76 O2 sat percent 0.4  Ventilator Settings NAG 3 L  . General: Comfortable at this time . Eyes: Grossly normal lids, irises & conjunctiva . ENT: grossly tongue is normal . Neck: no obvious mass . Cardiovascular: S1 S2 normal no gallop . Respiratory: No rales rhonchi noted . Abdomen: soft . Skin: no rash seen on limited exam . Musculoskeletal: not rigid . Psychiatric:unable to assess . Neurologic: no seizure no involuntary movements         Lab Data:   Basic Metabolic Panel: Recent Labs  Lab 04/15/19 1019 04/16/19 0532 04/18/19 0610 04/19/19 0600  NA 140  --  138  --   K 4.6  --  4.1  --   CL 102  --  99  --   CO2 30  --  28  --   GLUCOSE 142*  --  166*  --   BUN 13  --  14  --   CREATININE 0.32*  --  0.50  --   CALCIUM 8.9  --  9.0  --   MG 1.7 1.8 1.7 1.8  PHOS 3.9  --   --   --     ABG: No results for input(s): PHART, PCO2ART, PO2ART, HCO3, O2SAT in the last 168 hours.  Liver Function Tests: No results for input(s): AST, ALT, ALKPHOS, BILITOT, PROT, ALBUMIN in the last 168 hours. No results for input(s): LIPASE, AMYLASE in the last 168 hours. No results for input(s): AMMONIA in the last 168 hours.  CBC: Recent Labs  Lab 04/15/19 1019 04/18/19 0610  WBC 14.5* 9.5  HGB 9.2* 9.6*  HCT 29.9* 31.6*  MCV 92.9 92.9  PLT 488* 430*    Cardiac Enzymes: No results for input(s): CKTOTAL, CKMB, CKMBINDEX, TROPONINI in the last 168  hours.  BNP (last 3 results) No results for input(s): BNP in the last 8760 hours.  ProBNP (last 3 results) No results for input(s): PROBNP in the last 8760 hours.  Radiological Exams: No results found.  Assessment/Plan Active Problems:   Acute on chronic respiratory failure with hypoxia (HCC)   COVID-19 virus infection   Acute respiratory distress syndrome (ARDS) due to 2019 novel coronavirus   Severe sepsis ([place])   1. Acute on chronic respiratory failure with hypoxia patient is a 16-hour goal today on NAG device at 3 L.  Continue present pulmonary toilet supportive measures. 2. COVID-19 virus infection at baseline we will continue secretion management supportive care 3. ARDS treated we will continue present management 4. Severe sepsis hemodynamics are stable   I have personally seen and evaluated the patient, evaluated laboratory and imaging results, formulated the assessment and plan and placed orders. The Patient requires high complexity decision making for assessment and support.  Case was discussed on Rounds with the Respiratory Therapy Staff  Allyne Gee, MD [facility] Pulmonary Critical Care Medicine Sleep Medicine

## 2019-04-22 ENCOUNTER — Other Ambulatory Visit (HOSPITAL_COMMUNITY): Payer: BC Managed Care – PPO

## 2019-04-22 DIAGNOSIS — J9621 Acute and chronic respiratory failure with hypoxia: Secondary | ICD-10-CM | POA: Diagnosis not present

## 2019-04-22 DIAGNOSIS — U071 COVID-19: Secondary | ICD-10-CM | POA: Diagnosis not present

## 2019-04-22 DIAGNOSIS — J80 Acute respiratory distress syndrome: Secondary | ICD-10-CM | POA: Diagnosis not present

## 2019-04-22 DIAGNOSIS — A419 Sepsis, unspecified organism: Secondary | ICD-10-CM | POA: Diagnosis not present

## 2019-04-22 LAB — BASIC METABOLIC PANEL
Anion gap: 11 (ref 5–15)
BUN: 13 mg/dL (ref 6–20)
CO2: 29 mmol/L (ref 22–32)
Calcium: 9.4 mg/dL (ref 8.9–10.3)
Chloride: 100 mmol/L (ref 98–111)
Creatinine, Ser: 0.49 mg/dL (ref 0.44–1.00)
GFR calc Af Amer: >60 mL/min (ref >60)
GFR calc non Af Amer: >60 mL/min (ref >60)
Glucose, Bld: 207 mg/dL — ABNORMAL HIGH (ref 70–99)
Potassium: 4.4 mmol/L (ref 3.5–5.1)
Sodium: 140 mmol/L (ref 135–145)

## 2019-04-22 LAB — CBC
HCT: 32.5 % — ABNORMAL LOW (ref 36.0–46.0)
Hemoglobin: 10.3 g/dL — ABNORMAL LOW (ref 12.0–15.0)
MCH: 29.4 pg (ref 26.0–34.0)
MCHC: 31.7 g/dL (ref 30.0–36.0)
MCV: 92.9 fL (ref 80.0–100.0)
Platelets: 384 10*3/uL (ref 150–400)
RBC: 3.5 MIL/uL — ABNORMAL LOW (ref 3.87–5.11)
RDW: 18.8 % — ABNORMAL HIGH (ref 11.5–15.5)
WBC: 10.2 10*3/uL (ref 4.0–10.5)
nRBC: 0 % (ref 0.0–0.2)

## 2019-04-22 LAB — MAGNESIUM: Magnesium: 1.8 mg/dL (ref 1.7–2.4)

## 2019-04-22 NOTE — Progress Notes (Addendum)
Pulmonary [place]   PULMONARY CRITICAL CARE SERVICE  PROGRESS NOTE  Date of Service: 04/22/2019  Pamela Beasley  VQQ:[id number]  DOB: 1966/01/26   DOA: 04/10/2019  Referring Physician: Merton Border, MD  HPI: Pamela Beasley is a 53 y.o. female seen for follow up of Acute on Chronic Respiratory Failure.  Patient failed NAG device patient was on pressure support 12/5 FiO2 35% at this time.  Medications: Reviewed on Rounds  Physical Exam:  Vitals: Pulse 89 respiration 27 BP 151/85 O2 sat 98% 6  Ventilator Settings pressure support 12/5 FiO2 35%  . General: Comfortable at this time . Eyes: Grossly normal lids, irises & conjunctiva . ENT: grossly tongue is normal . Neck: no obvious mass . Cardiovascular: S1 S2 normal no gallop . Respiratory: No rales or rhonchi noted . Abdomen: soft . Skin: no rash seen on limited exam . Musculoskeletal: not rigid . Psychiatric:unable to assess . Neurologic: no seizure no involuntary movements         Lab Data:   Basic Metabolic Panel: Recent Labs  Lab 04/16/19 0532 04/18/19 0610 04/19/19 0600 04/22/19 1119  NA  --  138  --  140  K  --  4.1  --  4.4  CL  --  99  --  100  CO2  --  28  --  29  GLUCOSE  --  166*  --  207*  BUN  --  14  --  13  CREATININE  --  0.50  --  0.49  CALCIUM  --  9.0  --  9.4  MG 1.8 1.7 1.8 1.8    ABG: No results for input(s): PHART, PCO2ART, PO2ART, HCO3, O2SAT in the last 168 hours.  Liver Function Tests: No results for input(s): AST, ALT, ALKPHOS, BILITOT, PROT, ALBUMIN in the last 168 hours. No results for input(s): LIPASE, AMYLASE in the last 168 hours. No results for input(s): AMMONIA in the last 168 hours.  CBC: Recent Labs  Lab 04/18/19 0610 04/22/19 1119  WBC 9.5 10.2  HGB 9.6* 10.3*  HCT 31.6* 32.5*  MCV 92.9 92.9  PLT 430* 384    Cardiac Enzymes: No results for input(s): CKTOTAL, CKMB, CKMBINDEX, TROPONINI in the last 168  hours.  BNP (last 3 results) No results for input(s): BNP in the last 8760 hours.  ProBNP (last 3 results) No results for input(s): PROBNP in the last 8760 hours.  Radiological Exams: Dg Chest Port 1 View  Result Date: 04/22/2019 CLINICAL DATA:  Pneumonia. EXAM: PORTABLE CHEST 1 VIEW COMPARISON:  04/17/2019. FINDINGS: Tracheostomy tube and right PICC line stable position. Heart size stable. Multifocal bilateral pulmonary infiltrates are again noted. No interim change. No prominent pleural effusion. No pneumothorax. IMPRESSION: 1.  Tracheostomy tube right PICC line stable position. 2. Multifocal bilateral pulmonary infiltrates are again noted. No interim change. Electronically Signed   By: Marcello Moores  Register   On: 04/22/2019 06:35    Assessment/Plan Active Problems:   Acute on chronic respiratory failure with hypoxia ([place])   COVID-19 virus infection   Acute respiratory distress syndrome (ARDS) due to 2019 novel coronavirus   Severe sepsis ([place])   1. Acute on chronic respiratory failure with hypoxia  patient failed weaning today.  Pressure 12/5 FiO2 35% continue aggressive pulmonary toilet supportive 2. COVID-19 virus infection at baseline we will continue secretion management supportive care 3. ARDS treated we will continue present management 4. Severe sepsis hemodynamics are stable   I  have personally seen and evaluated the patient, evaluated laboratory and imaging results, formulated the assessment and plan and placed orders. The Patient requires high complexity decision making for assessment and support.  Case was discussed on Rounds with the Respiratory Therapy Staff  Allyne Gee, MD [facility] Pulmonary Critical Care Medicine Sleep Medicine

## 2019-04-23 DIAGNOSIS — U071 COVID-19: Secondary | ICD-10-CM | POA: Diagnosis not present

## 2019-04-23 DIAGNOSIS — J9621 Acute and chronic respiratory failure with hypoxia: Secondary | ICD-10-CM | POA: Diagnosis not present

## 2019-04-23 DIAGNOSIS — A419 Sepsis, unspecified organism: Secondary | ICD-10-CM | POA: Diagnosis not present

## 2019-04-23 DIAGNOSIS — J80 Acute respiratory distress syndrome: Secondary | ICD-10-CM | POA: Diagnosis not present

## 2019-04-23 NOTE — Progress Notes (Signed)
Pulmonary Critical Care Medicine [place]   PULMONARY CRITICAL CARE SERVICE  PROGRESS NOTE  Date of Service: 04/23/2019  Pamela Beasley  [id number]  DOB: [birth year]   DOA: 04/10/2019  Referring Physician: Merton Border, MD  HPI: Pamela Beasley is a 53 y.o. female seen for follow up of Acute on Chronic Respiratory Failure.  Patient currently is on 6 L oxygen with good saturations.  Medications: Reviewed on Rounds  Physical Exam:  Vitals: Temperature 96.0 pulse 100 respiratory rate 32 blood pressure is 158/81 saturations 96%  Ventilator Settings off the ventilator on nag device  . General: Comfortable at this time . Eyes: Grossly normal lids, irises & conjunctiva . ENT: grossly tongue is normal . Neck: no obvious mass . Cardiovascular: S1 S2 normal no gallop . Respiratory: No rhonchi no rales are noted at this time . Abdomen: soft . Skin: no rash seen on limited exam . Musculoskeletal: not rigid . Psychiatric:unable to assess . Neurologic: no seizure no involuntary movements         Lab Data:   Basic Metabolic Panel: Recent Labs  Lab 04/18/19 0610 04/19/19 0600 04/22/19 1119  NA 138  --  140  K 4.1  --  4.4  CL 99  --  100  CO2 28  --  29  GLUCOSE 166*  --  207*  BUN 14  --  13  CREATININE 0.50  --  0.49  CALCIUM 9.0  --  9.4  MG 1.7 1.8 1.8    ABG: No results for input(s): PHART, PCO2ART, PO2ART, HCO3, O2SAT in the last 168 hours.  Liver Function Tests: No results for input(s): AST, ALT, ALKPHOS, BILITOT, PROT, ALBUMIN in the last 168 hours. No results for input(s): LIPASE, AMYLASE in the last 168 hours. No results for input(s): AMMONIA in the last 168 hours.  CBC: Recent Labs  Lab 04/18/19 0610 04/22/19 1119  WBC 9.5 10.2  HGB 9.6* 10.3*  HCT 31.6* 32.5*  MCV 92.9 92.9  PLT 430* 384    Cardiac Enzymes: No results for input(s): CKTOTAL, CKMB, CKMBINDEX, TROPONINI in the last 168 hours.  BNP (last 3  results) No results for input(s): BNP in the last 8760 hours.  ProBNP (last 3 results) No results for input(s): PROBNP in the last 8760 hours.  Radiological Exams: Dg Chest Port 1 View  Result Date: 04/22/2019 CLINICAL DATA:  Pneumonia. EXAM: PORTABLE CHEST 1 VIEW COMPARISON:  04/17/2019. FINDINGS: Tracheostomy tube and right PICC line stable position. Heart size stable. Multifocal bilateral pulmonary infiltrates are again noted. No interim change. No prominent pleural effusion. No pneumothorax. IMPRESSION: 1.  Tracheostomy tube right PICC line stable position. 2. Multifocal bilateral pulmonary infiltrates are again noted. No interim change. Electronically Signed   By: Marcello Moores  Register   On: 04/22/2019 06:35    Assessment/Plan Active Problems:   Acute on chronic respiratory failure with hypoxia ([place])   COVID-19 virus infection   Acute respiratory distress syndrome (ARDS) due to 2019 novel coronavirus   Severe sepsis ([place])   1. Acute on chronic respiratory failure with hypoxia we will continue with oxygen therapy on the nag device patient currently is on 6 L 2. COVID-19 virus infection in resolution phase 3. ARDS slowly improving 4. Severe sepsis hemodynamics are stable   I have personally seen and evaluated the patient, evaluated laboratory and imaging results, formulated the assessment and plan and placed orders. The Patient requires high complexity decision making for assessment and support.  Case  was discussed on Rounds with the Respiratory Therapy Staff  Allyne Gee, MD [facility] Pulmonary Critical Care Medicine Sleep Medicine

## 2019-04-24 DIAGNOSIS — J9621 Acute and chronic respiratory failure with hypoxia: Secondary | ICD-10-CM | POA: Diagnosis not present

## 2019-04-24 DIAGNOSIS — J80 Acute respiratory distress syndrome: Secondary | ICD-10-CM | POA: Diagnosis not present

## 2019-04-24 DIAGNOSIS — A419 Sepsis, unspecified organism: Secondary | ICD-10-CM | POA: Diagnosis not present

## 2019-04-24 DIAGNOSIS — U071 COVID-19: Secondary | ICD-10-CM | POA: Diagnosis not present

## 2019-04-24 NOTE — Progress Notes (Signed)
Pulmonary Critical Care Medicine [place]as[place]   PULMONARY CRITICAL CARE SERVICE  PROGRESS NOTE  Date of Service: 04/24/2019  Pamela Beasley  SNK:[id number]  DOB: October 25, 1965   DOA: 04/10/2019  Referring Physician: Merton Border, MD  HPI: Pamela Beasley is a 53 y.o. female seen for follow up of Acute on Chronic Respiratory Failure.  Patient remains off the ventilator currently is on the nag device currently on 5 L oxygen the goal is for 12 hours today  Medications: Reviewed on Rounds  Physical Exam:  Vitals: Temperature 97.2 pulse 68 respiratory rate 19 blood pressure 180/84 saturations 100%  Ventilator Settings patient currently is off the ventilator on the nag device  . General: Comfortable at this time . Eyes: Grossly normal lids, irises & conjunctiva . ENT: grossly tongue is normal . Neck: no obvious mass . Cardiovascular: S1 S2 normal no gallop . Respiratory: No rhonchi no rales are noted at this time . Abdomen: soft . Skin: no rash seen on limited exam . Musculoskeletal: not rigid . Psychiatric:unable to assess . Neurologic: no seizure no involuntary movements         Lab Data:   Basic Metabolic Panel: Recent Labs  Lab 04/18/19 0610 04/19/19 0600 04/22/19 1119  NA 138  --  140  K 4.1  --  4.4  CL 99  --  100  CO2 28  --  29  GLUCOSE 166*  --  207*  BUN 14  --  13  CREATININE 0.50  --  0.49  CALCIUM 9.0  --  9.4  MG 1.7 1.8 1.8    ABG: No results for input(s): PHART, PCO2ART, PO2ART, HCO3, O2SAT in the last 168 hours.  Liver Function Tests: No results for input(s): AST, ALT, ALKPHOS, BILITOT, PROT, ALBUMIN in the last 168 hours. No results for input(s): LIPASE, AMYLASE in the last 168 hours. No results for input(s): AMMONIA in the last 168 hours.  CBC: Recent Labs  Lab 04/18/19 0610 04/22/19 1119  WBC 9.5 10.2  HGB 9.6* 10.3*  HCT 31.6* 32.5*  MCV 92.9 92.9  PLT 430* 384    Cardiac Enzymes: No results for  input(s): CKTOTAL, CKMB, CKMBINDEX, TROPONINI in the last 168 hours.  BNP (last 3 results) No results for input(s): BNP in the last 8760 hours.  ProBNP (last 3 results) No results for input(s): PROBNP in the last 8760 hours.  Radiological Exams: No results found.  Assessment/Plan Active Problems:   Acute on chronic respiratory failure with hypoxia (HCC)   COVID-19 virus infection   Acute respiratory distress syndrome (ARDS) due to 2019 novel coronavirus   Severe sepsis ([place])   1. Acute on chronic respiratory failure with hypoxia continues to wean doing well plan is to continue to advance off the ventilator.  On vancomycin titrate oxygen down as tolerated well 2. COVID-19 virus infection in resolution phase we will continue with supportive care 3. ARDS treated we will continue to follow along. 4. Severe sepsis hemodynamics are stable   I have personally seen and evaluated the patient, evaluated laboratory and imaging results, formulated the assessment and plan and placed orders. The Patient requires high complexity decision making for assessment and support.  Case was discussed on Rounds with the Respiratory Therapy Staff  Allyne Gee, MD [facility] Pulmonary Critical Care Medicine Sleep Medicine

## 2019-04-25 DIAGNOSIS — A419 Sepsis, unspecified organism: Secondary | ICD-10-CM | POA: Diagnosis not present

## 2019-04-25 DIAGNOSIS — U071 COVID-19: Secondary | ICD-10-CM | POA: Diagnosis not present

## 2019-04-25 DIAGNOSIS — J9621 Acute and chronic respiratory failure with hypoxia: Secondary | ICD-10-CM | POA: Diagnosis not present

## 2019-04-25 DIAGNOSIS — J80 Acute respiratory distress syndrome: Secondary | ICD-10-CM | POA: Diagnosis not present

## 2019-04-25 LAB — CBC
HCT: 30.6 % — ABNORMAL LOW (ref 36.0–46.0)
Hemoglobin: 9.5 g/dL — ABNORMAL LOW (ref 12.0–15.0)
MCH: 29.1 pg (ref 26.0–34.0)
MCHC: 31 g/dL (ref 30.0–36.0)
MCV: 93.6 fL (ref 80.0–100.0)
Platelets: 340 10*3/uL (ref 150–400)
RBC: 3.27 MIL/uL — ABNORMAL LOW (ref 3.87–5.11)
RDW: 18.7 % — ABNORMAL HIGH (ref 11.5–15.5)
WBC: 11.7 10*3/uL — ABNORMAL HIGH (ref 4.0–10.5)
nRBC: 0 % (ref 0.0–0.2)

## 2019-04-25 LAB — BASIC METABOLIC PANEL
Anion gap: 11 (ref 5–15)
BUN: 21 mg/dL — ABNORMAL HIGH (ref 6–20)
CO2: 30 mmol/L (ref 22–32)
Calcium: 9.5 mg/dL (ref 8.9–10.3)
Chloride: 99 mmol/L (ref 98–111)
Creatinine, Ser: 0.55 mg/dL (ref 0.44–1.00)
GFR calc Af Amer: >60 mL/min (ref >60)
GFR calc non Af Amer: >60 mL/min (ref >60)
Glucose, Bld: 128 mg/dL — ABNORMAL HIGH (ref 70–99)
Potassium: 3.6 mmol/L (ref 3.5–5.1)
Sodium: 140 mmol/L (ref 135–145)

## 2019-04-25 LAB — PHOSPHORUS: Phosphorus: 4.6 mg/dL (ref 2.5–4.6)

## 2019-04-25 LAB — MAGNESIUM: Magnesium: 1.7 mg/dL (ref 1.7–2.4)

## 2019-04-25 NOTE — Progress Notes (Addendum)
Pulmonary Critical Care Medicine [place]   PULMONARY CRITICAL CARE SERVICE  PROGRESS NOTE  Date of Service: 04/25/2019  Pamela Beasley  KWI:[id number]  DOB: 12/25/1965   DOA: 04/10/2019  Referring Physician: Merton Border, MD  HPI: Pamela Beasley is a 53 y.o. female seen for follow up of Acute on Chronic Respiratory Failure.  Patient remains on 4 L via NAG device.  Currently satting well with no fever stress test 6 Toradol.  Medications: Reviewed on Rounds  Physical Exam:  Vitals: Pulse 85 respirations 24 BP 126/74 O2 sat 99% temp 96.7  Ventilator Settings nag 4 L  . General: Comfortable at this time . Eyes: Grossly normal lids, irises & conjunctiva . ENT: grossly tongue is normal . Neck: no obvious mass . Cardiovascular: S1 S2 normal no gallop . Respiratory: No rales rhonchi noted . Abdomen: soft . Skin: no rash seen on limited exam . Musculoskeletal: not rigid . Psychiatric:unable to assess . Neurologic: no seizure no involuntary movements         Lab Data:   Basic Metabolic Panel: Recent Labs  Lab 04/19/19 0600 04/22/19 1119 04/25/19 0653  NA  --  140 140  K  --  4.4 3.6  CL  --  100 99  CO2  --  29 30  GLUCOSE  --  207* 128*  BUN  --  13 21*  CREATININE  --  0.49 0.55  CALCIUM  --  9.4 9.5  MG 1.8 1.8 1.7  PHOS  --   --  4.6    ABG: No results for input(s): PHART, PCO2ART, PO2ART, HCO3, O2SAT in the last 168 hours.  Liver Function Tests: No results for input(s): AST, ALT, ALKPHOS, BILITOT, PROT, ALBUMIN in the last 168 hours. No results for input(s): LIPASE, AMYLASE in the last 168 hours. No results for input(s): AMMONIA in the last 168 hours.  CBC: Recent Labs  Lab 04/22/19 1119 04/25/19 0653  WBC 10.2 11.7*  HGB 10.3* 9.5*  HCT 32.5* 30.6*  MCV 92.9 93.6  PLT 384 340    Cardiac Enzymes: No results for input(s): CKTOTAL, CKMB, CKMBINDEX, TROPONINI in the last 168 hours.  BNP (last 3 results) No  results for input(s): BNP in the last 8760 hours.  ProBNP (last 3 results) No results for input(s): PROBNP in the last 8760 hours.  Radiological Exams: No results found.  Assessment/Plan Active Problems:   Acute on chronic respiratory failure with hypoxia (HCC)   COVID-19 virus infection   Acute respiratory distress syndrome (ARDS) due to 2019 novel coronavirus (HCC)   Severe sepsis ([place])   1. Acute on chronic respiratory failure with hypoxia doing well at this time weaning.  Continue aggressive pulmonary toilet supportive measures. 2. COVID-19 virus infection in resolution phase we will continue with supportive care 3. ARDS treated we will continue to follow along. 4. Severe sepsis hemodynamics are stable   I have personally seen and evaluated the patient, evaluated laboratory and imaging results, formulated the assessment and plan and placed orders. The Patient requires high complexity decision making for assessment and support.  Case was discussed on Rounds with the Respiratory Therapy Staff  Allyne Gee, MD [facility] Pulmonary Critical Care Medicine Sleep Medicine

## 2019-04-26 DIAGNOSIS — A419 Sepsis, unspecified organism: Secondary | ICD-10-CM | POA: Diagnosis not present

## 2019-04-26 DIAGNOSIS — J80 Acute respiratory distress syndrome: Secondary | ICD-10-CM | POA: Diagnosis not present

## 2019-04-26 DIAGNOSIS — J9621 Acute and chronic respiratory failure with hypoxia: Secondary | ICD-10-CM | POA: Diagnosis not present

## 2019-04-26 DIAGNOSIS — U071 COVID-19: Secondary | ICD-10-CM | POA: Diagnosis not present

## 2019-04-26 NOTE — Progress Notes (Addendum)
Pulmonary Critical Care Medicine [place]   PULMONARY CRITICAL CARE SERVICE  PROGRESS NOTE  Date of Service: 04/26/2019  Pamela Beasley  ZOX:[id number]  DOB: Jul 29, 1965   DOA: 04/10/2019  Referring Physician: Merton Border, MD  HPI: Pamela Beasley is a 53 y.o. female seen for follow up of Acute on Chronic Respiratory Failure.  Patient has now been 24 hours on and NAG at 5 L.  Satting well no fever or distress.  Medications: Reviewed on Rounds  Physical Exam:  Vitals: Pulse 88 respirations 29 BP 120/72 O2 sat 100% temp 97 0  Ventilator Settings NAG 5 L  . General: Comfortable at this time . Eyes: Grossly normal lids, irises & conjunctiva . ENT: grossly tongue is normal . Neck: no obvious mass . Cardiovascular: S1 S2 normal no gallop . Respiratory: No rales or rhonchi noted . Abdomen: soft . Skin: no rash seen on limited exam . Musculoskeletal: not rigid . Psychiatric:unable to assess . Neurologic: no seizure no involuntary movements         Lab Data:   Basic Metabolic Panel: Recent Labs  Lab 04/22/19 1119 04/25/19 0653  NA 140 140  K 4.4 3.6  CL 100 99  CO2 29 30  GLUCOSE 207* 128*  BUN 13 21*  CREATININE 0.49 0.55  CALCIUM 9.4 9.5  MG 1.8 1.7  PHOS  --  4.6    ABG: No results for input(s): PHART, PCO2ART, PO2ART, HCO3, O2SAT in the last 168 hours.  Liver Function Tests: No results for input(s): AST, ALT, ALKPHOS, BILITOT, PROT, ALBUMIN in the last 168 hours. No results for input(s): LIPASE, AMYLASE in the last 168 hours. No results for input(s): AMMONIA in the last 168 hours.  CBC: Recent Labs  Lab 04/22/19 1119 04/25/19 0653  WBC 10.2 11.7*  HGB 10.3* 9.5*  HCT 32.5* 30.6*  MCV 92.9 93.6  PLT 384 340    Cardiac Enzymes: No results for input(s): CKTOTAL, CKMB, CKMBINDEX, TROPONINI in the last 168 hours.  BNP (last 3 results) No results for input(s): BNP in the last 8760 hours.  ProBNP (last 3  results) No results for input(s): PROBNP in the last 8760 hours.  Radiological Exams: No results found.  Assessment/Plan Active Problems:   Acute on chronic respiratory failure with hypoxia (HCC)   COVID-19 virus infection   Acute respiratory distress syndrome (ARDS) due to 2019 novel coronavirus (HCC)   Severe sepsis ([place])   1. Acute on chronic respiratory failure with hypoxia doing well at this time weaning.  Continue aggressive pulmonary toilet supportive measures. 2. COVID-19 virus infection in resolution phase we will continue with supportive care 3. ARDS treated we will continue to follow along. 4. Severe sepsis hemodynamics are stable   I have personally seen and evaluated the patient, evaluated laboratory and imaging results, formulated the assessment and plan and placed orders. The Patient requires high complexity decision making for assessment and support.  Case was discussed on Rounds with the Respiratory Therapy Staff  Allyne Gee, MD [facility] Pulmonary Critical Care Medicine Sleep Medicine

## 2019-04-27 DIAGNOSIS — A419 Sepsis, unspecified organism: Secondary | ICD-10-CM | POA: Diagnosis not present

## 2019-04-27 DIAGNOSIS — J9621 Acute and chronic respiratory failure with hypoxia: Secondary | ICD-10-CM | POA: Diagnosis not present

## 2019-04-27 DIAGNOSIS — J80 Acute respiratory distress syndrome: Secondary | ICD-10-CM | POA: Diagnosis not present

## 2019-04-27 DIAGNOSIS — U071 COVID-19: Secondary | ICD-10-CM | POA: Diagnosis not present

## 2019-04-27 NOTE — Progress Notes (Addendum)
Pulmonary Critical Care Medicine [place]   PULMONARY CRITICAL CARE SERVICE  PROGRESS NOTE  Date of Service: 04/27/2019  Pamela Beasley  IRC:[id number]  DOB: 02-Aug-1965   DOA: 04/10/2019  Referring Physician: Merton Border, MD  HPI: Pamela Beasley is a 53 y.o. female seen for follow up of Acute on Chronic Respiratory Failure.  Patient has not been 48 hours on NAG at 5 L satting well with no distress.  Will been capping trials today.  Medications: Reviewed on Rounds  Physical Exam:  Vitals: Pulse 95 respirations of 3 BP 148/84 O2 sat percent percent time I 6.3  Ventilator Settings NAG 5 L  . General: Comfortable at this time . Eyes: Grossly normal lids, irises & conjunctiva . ENT: grossly tongue is normal . Neck: no obvious mass . Cardiovascular: S1 S2 normal no gallop . Respiratory: No rales or rhonchi noted . Abdomen: soft . Skin: no rash seen on limited exam . Musculoskeletal: not rigid . Psychiatric:unable to assess . Neurologic: no seizure no involuntary movements         Lab Data:   Basic Metabolic Panel: Recent Labs  Lab 04/22/19 1119 04/25/19 0653  NA 140 140  K 4.4 3.6  CL 100 99  CO2 29 30  GLUCOSE 207* 128*  BUN 13 21*  CREATININE 0.49 0.55  CALCIUM 9.4 9.5  MG 1.8 1.7  PHOS  --  4.6    ABG: No results for input(s): PHART, PCO2ART, PO2ART, HCO3, O2SAT in the last 168 hours.  Liver Function Tests: No results for input(s): AST, ALT, ALKPHOS, BILITOT, PROT, ALBUMIN in the last 168 hours. No results for input(s): LIPASE, AMYLASE in the last 168 hours. No results for input(s): AMMONIA in the last 168 hours.  CBC: Recent Labs  Lab 04/22/19 1119 04/25/19 0653  WBC 10.2 11.7*  HGB 10.3* 9.5*  HCT 32.5* 30.6*  MCV 92.9 93.6  PLT 384 340    Cardiac Enzymes: No results for input(s): CKTOTAL, CKMB, CKMBINDEX, TROPONINI in the last 168 hours.  BNP (last 3 results) No results for input(s): BNP in the last 8760  hours.  ProBNP (last 3 results) No results for input(s): PROBNP in the last 8760 hours.  Radiological Exams: No results found.  Assessment/Plan Active Problems:   Acute on chronic respiratory failure with hypoxia (HCC)   COVID-19 virus infection   Acute respiratory distress syndrome (ARDS) due to 2019 novel coronavirus (HCC)   Severe sepsis ([place])   1. Acute on chronic respiratory failure with hypoxiadoing well at this time weaning. Continue aggressive pulmonary toilet supportive measures. 2. COVID-19 virus infection in resolution phase we will continue with supportive care 3. ARDS treated we will continue to follow along. 4. Severe sepsis hemodynamics are stable   I have personally seen and evaluated the patient, evaluated laboratory and imaging results, formulated the assessment and plan and placed orders. The Patient requires high complexity decision making for assessment and support.  Case was discussed on Rounds with the Respiratory Therapy Staff  Allyne Gee, MD [facility] Pulmonary Critical Care Medicine Sleep Medicine

## 2019-04-28 DIAGNOSIS — A419 Sepsis, unspecified organism: Secondary | ICD-10-CM | POA: Diagnosis not present

## 2019-04-28 DIAGNOSIS — J80 Acute respiratory distress syndrome: Secondary | ICD-10-CM | POA: Diagnosis not present

## 2019-04-28 DIAGNOSIS — U071 COVID-19: Secondary | ICD-10-CM | POA: Diagnosis not present

## 2019-04-28 DIAGNOSIS — J9621 Acute and chronic respiratory failure with hypoxia: Secondary | ICD-10-CM | POA: Diagnosis not present

## 2019-04-28 LAB — BASIC METABOLIC PANEL
Anion gap: 11 (ref 5–15)
BUN: 13 mg/dL (ref 6–20)
CO2: 31 mmol/L (ref 22–32)
Calcium: 9.6 mg/dL (ref 8.9–10.3)
Chloride: 98 mmol/L (ref 98–111)
Creatinine, Ser: 0.45 mg/dL (ref 0.44–1.00)
GFR calc Af Amer: >60 mL/min (ref >60)
GFR calc non Af Amer: >60 mL/min (ref >60)
Glucose, Bld: 134 mg/dL — ABNORMAL HIGH (ref 70–99)
Potassium: 3.9 mmol/L (ref 3.5–5.1)
Sodium: 140 mmol/L (ref 135–145)

## 2019-04-28 LAB — CBC
HCT: 34.6 % — ABNORMAL LOW (ref 36.0–46.0)
Hemoglobin: 10.9 g/dL — ABNORMAL LOW (ref 12.0–15.0)
MCH: 29.9 pg (ref 26.0–34.0)
MCHC: 31.5 g/dL (ref 30.0–36.0)
MCV: 94.8 fL (ref 80.0–100.0)
Platelets: 339 10*3/uL (ref 150–400)
RBC: 3.65 MIL/uL — ABNORMAL LOW (ref 3.87–5.11)
RDW: 18.2 % — ABNORMAL HIGH (ref 11.5–15.5)
WBC: 13.2 10*3/uL — ABNORMAL HIGH (ref 4.0–10.5)
nRBC: 0 % (ref 0.0–0.2)

## 2019-04-28 LAB — PHOSPHORUS: Phosphorus: 5.4 mg/dL — ABNORMAL HIGH (ref 2.5–4.6)

## 2019-04-28 LAB — URINALYSIS, ROUTINE W REFLEX MICROSCOPIC
Bilirubin Urine: NEGATIVE
Glucose, UA: 50 mg/dL — AB
Hgb urine dipstick: NEGATIVE
Ketones, ur: NEGATIVE mg/dL
Nitrite: NEGATIVE
Protein, ur: NEGATIVE mg/dL
Specific Gravity, Urine: 1.015 (ref 1.005–1.030)
pH: 7 (ref 5.0–8.0)

## 2019-04-28 LAB — MAGNESIUM: Magnesium: 1.7 mg/dL (ref 1.7–2.4)

## 2019-04-28 NOTE — Progress Notes (Addendum)
Pulmonary Critical Care Medicine [place]   PULMONARY CRITICAL CARE SERVICE  PROGRESS NOTE  Date of Service: 04/28/2019  Caera Enwright  PZW:[id number]  DOB: 11-29-1965   DOA: 04/10/2019  Referring Physician: Merton Border, MD  HPI: Janeene Sand is a 53 y.o. female seen for follow up of Acute on Chronic Respiratory Failure.  Patient is currently capped and on 3 L nasal cannula satting well with no distress.  Medications: Reviewed on Rounds  Physical Exam:  Vitals: Pulse 81% 26 BP 150/90 O2 sat 100% temp 96.6  Ventilator Settings 3 L nasal cannula  . General: Comfortable at this time . Eyes: Grossly normal lids, irises & conjunctiva . ENT: grossly tongue is normal . Neck: no obvious mass . Cardiovascular: S1 S2 normal no gallop . Respiratory: No rales or rhonchi noted . Abdomen: soft . Skin: no rash seen on limited exam . Musculoskeletal: not rigid . Psychiatric:unable to assess . Neurologic: no seizure no involuntary movements         Lab Data:   Basic Metabolic Panel: Recent Labs  Lab 04/22/19 1119 04/25/19 0653 04/28/19 1033  NA 140 140 140  K 4.4 3.6 3.9  CL 100 99 98  CO2 29 30 31   GLUCOSE 207* 128* 134*  BUN 13 21* 13  CREATININE 0.49 0.55 0.45  CALCIUM 9.4 9.5 9.6  MG 1.8 1.7 1.7  PHOS  --  4.6 5.4*    ABG: No results for input(s): PHART, PCO2ART, PO2ART, HCO3, O2SAT in the last 168 hours.  Liver Function Tests: No results for input(s): AST, ALT, ALKPHOS, BILITOT, PROT, ALBUMIN in the last 168 hours. No results for input(s): LIPASE, AMYLASE in the last 168 hours. No results for input(s): AMMONIA in the last 168 hours.  CBC: Recent Labs  Lab 04/22/19 1119 04/25/19 0653 04/28/19 1033  WBC 10.2 11.7* 13.2*  HGB 10.3* 9.5* 10.9*  HCT 32.5* 30.6* 34.6*  MCV 92.9 93.6 94.8  PLT 384 340 339    Cardiac Enzymes: No results for input(s): CKTOTAL, CKMB, CKMBINDEX, TROPONINI in the last 168 hours.  BNP (last  3 results) No results for input(s): BNP in the last 8760 hours.  ProBNP (last 3 results) No results for input(s): PROBNP in the last 8760 hours.  Radiological Exams: No results found.  Assessment/Plan Active Problems:   Acute on chronic respiratory failure with hypoxia (HCC)   COVID-19 virus infection   Acute respiratory distress syndrome (ARDS) due to 2019 novel coronavirus (HCC)   Severe sepsis ([place])   1. Acute on chronic respiratory failure with hypoxiadoing well at this time weaning. Continue aggressive pulmonary toilet supportive measures. 2. COVID-19 virus infection in resolution phase we will continue with supportive care 3. ARDS treated we will continue to follow along. 4. Severe sepsis hemodynamics are stable   I have personally seen and evaluated the patient, evaluated laboratory and imaging results, formulated the assessment and plan and placed orders. The Patient requires high complexity decision making for assessment and support.  Case was discussed on Rounds with the Respiratory Therapy Staff  Allyne Gee, MD [facility] Pulmonary Critical Care Medicine Sleep Medicine

## 2019-04-29 ENCOUNTER — Other Ambulatory Visit (HOSPITAL_COMMUNITY): Payer: BC Managed Care – PPO

## 2019-04-29 DIAGNOSIS — J80 Acute respiratory distress syndrome: Secondary | ICD-10-CM | POA: Diagnosis not present

## 2019-04-29 DIAGNOSIS — J9621 Acute and chronic respiratory failure with hypoxia: Secondary | ICD-10-CM | POA: Diagnosis not present

## 2019-04-29 DIAGNOSIS — A419 Sepsis, unspecified organism: Secondary | ICD-10-CM | POA: Diagnosis not present

## 2019-04-29 DIAGNOSIS — U071 COVID-19: Secondary | ICD-10-CM | POA: Diagnosis not present

## 2019-04-29 LAB — MAGNESIUM: Magnesium: 1.9 mg/dL (ref 1.7–2.4)

## 2019-04-29 NOTE — Progress Notes (Addendum)
Pulmonary Critical Care Medicine [place]   PULMONARY CRITICAL CARE SERVICE  PROGRESS NOTE  Date of Service: 04/29/2019  Pamela Beasley  GGY:[id number]  DOB: 1966/01/07   DOA: 04/10/2019  Referring Physician: Merton Border, MD  HPI: Pamela Beasley is a 53 y.o. female seen for follow up of Acute on Chronic Respiratory Failure.  Patient has been capped for 24 hours currently on 3 L of oxygen satting well.  Medications: Reviewed on Rounds  Physical Exam:  Vitals: Pulse 96 respirations 22 BP 142/81 O2 sat 97% temp 96.7  Ventilator Settings 3 L nasal cannula  . General: Comfortable at this time . Eyes: Grossly normal lids, irises & conjunctiva . ENT: grossly tongue is normal . Neck: no obvious mass . Cardiovascular: S1 S2 normal no gallop . Respiratory: No rales or rhonchi noted . Abdomen: soft . Skin: no rash seen on limited exam . Musculoskeletal: not rigid . Psychiatric:unable to assess . Neurologic: no seizure no involuntary movements         Lab Data:   Basic Metabolic Panel: Recent Labs  Lab 04/25/19 0653 04/28/19 1033 04/29/19 0634  NA 140 140  --   K 3.6 3.9  --   CL 99 98  --   CO2 30 31  --   GLUCOSE 128* 134*  --   BUN 21* 13  --   CREATININE 0.55 0.45  --   CALCIUM 9.5 9.6  --   MG 1.7 1.7 1.9  PHOS 4.6 5.4*  --     ABG: No results for input(s): PHART, PCO2ART, PO2ART, HCO3, O2SAT in the last 168 hours.  Liver Function Tests: No results for input(s): AST, ALT, ALKPHOS, BILITOT, PROT, ALBUMIN in the last 168 hours. No results for input(s): LIPASE, AMYLASE in the last 168 hours. No results for input(s): AMMONIA in the last 168 hours.  CBC: Recent Labs  Lab 04/25/19 0653 04/28/19 1033  WBC 11.7* 13.2*  HGB 9.5* 10.9*  HCT 30.6* 34.6*  MCV 93.6 94.8  PLT 340 339    Cardiac Enzymes: No results for input(s): CKTOTAL, CKMB, CKMBINDEX, TROPONINI in the last 168 hours.  BNP (last 3 results) No results for  input(s): BNP in the last 8760 hours.  ProBNP (last 3 results) No results for input(s): PROBNP in the last 8760 hours.  Radiological Exams: Dg Chest Port 1 View  Result Date: 04/29/2019 CLINICAL DATA:  Pneumonia. EXAM: PORTABLE CHEST 1 VIEW COMPARISON:  Radiograph of April 22, 2019. FINDINGS: Stable cardiomediastinal silhouette. Tracheostomy tube is unchanged in position. Stable bilateral lung opacities are noted concerning for possible pneumonia. Bony thorax unremarkable. IMPRESSION: Stable bilateral lung opacities as described above. Electronically Signed   By: Marijo Conception M.D.   On: 04/29/2019 07:22    Assessment/Plan Active Problems:   Acute on chronic respiratory failure with hypoxia (HCC)   COVID-19 virus infection   Acute respiratory distress syndrome (ARDS) due to 2019 novel coronavirus (HCC)   Severe sepsis ([place])   1. Acute on chronic respiratory failure with hypoxiadoing well at this time weaning. Continue aggressive pulmonary toilet supportive measures.  2. COVID-19 virus infection in resolution phase we will continue with supportive care 3. ARDS treated we will continue to follow along. 4. Severe sepsis hemodynamics are stable   I have personally seen and evaluated the patient, evaluated laboratory and imaging results, formulated the assessment and plan and placed orders. The Patient requires high complexity decision making for assessment and support.  Case  was discussed on Rounds with the Respiratory Therapy Staff  Allyne Gee, MD [facility] Pulmonary Critical Care Medicine Sleep Medicine

## 2019-04-30 DIAGNOSIS — J9621 Acute and chronic respiratory failure with hypoxia: Secondary | ICD-10-CM | POA: Diagnosis not present

## 2019-04-30 DIAGNOSIS — J80 Acute respiratory distress syndrome: Secondary | ICD-10-CM | POA: Diagnosis not present

## 2019-04-30 DIAGNOSIS — U071 COVID-19: Secondary | ICD-10-CM | POA: Diagnosis not present

## 2019-04-30 DIAGNOSIS — A419 Sepsis, unspecified organism: Secondary | ICD-10-CM | POA: Diagnosis not present

## 2019-04-30 NOTE — Progress Notes (Addendum)
Pulmonary Critical Care Medicine [place]   PULMONARY CRITICAL CARE SERVICE  PROGRESS NOTE  Date of Service: 04/30/2019  Pamela Beasley  ZHG:[id number]  DOB: 09/15/1965   DOA: 04/10/2019  Referring Physician: Merton Border, MD  HPI: Pamela Beasley is a 53 y.o. female seen for follow up of Acute on Chronic Respiratory Failure.  Patient is now been 48 hours To downsize trach today to 4 cuffless currently on 3 L nasal cannula.  Medications: Reviewed on Rounds  Physical Exam:  Vitals: Pulse 84 respirations 20 BP 130/78 O2 sat 100% temp 98.2  Ventilator Settings 3 L  . General: Comfortable at this time . Eyes: Grossly normal lids, irises & conjunctiva . ENT: grossly tongue is normal . Neck: no obvious mass . Cardiovascular: S1 S2 normal no gallop . Respiratory: No rales or rhonchi noted . Abdomen: soft . Skin: no rash seen on limited exam . Musculoskeletal: not rigid . Psychiatric:unable to assess . Neurologic: no seizure no involuntary movements         Lab Data:   Basic Metabolic Panel: Recent Labs  Lab 04/25/19 0653 04/28/19 1033 04/29/19 0634  NA 140 140  --   K 3.6 3.9  --   CL 99 98  --   CO2 30 31  --   GLUCOSE 128* 134*  --   BUN 21* 13  --   CREATININE 0.55 0.45  --   CALCIUM 9.5 9.6  --   MG 1.7 1.7 1.9  PHOS 4.6 5.4*  --     ABG: No results for input(s): PHART, PCO2ART, PO2ART, HCO3, O2SAT in the last 168 hours.  Liver Function Tests: No results for input(s): AST, ALT, ALKPHOS, BILITOT, PROT, ALBUMIN in the last 168 hours. No results for input(s): LIPASE, AMYLASE in the last 168 hours. No results for input(s): AMMONIA in the last 168 hours.  CBC: Recent Labs  Lab 04/25/19 0653 04/28/19 1033  WBC 11.7* 13.2*  HGB 9.5* 10.9*  HCT 30.6* 34.6*  MCV 93.6 94.8  PLT 340 339    Cardiac Enzymes: No results for input(s): CKTOTAL, CKMB, CKMBINDEX, TROPONINI in the last 168 hours.  BNP (last 3 results) No  results for input(s): BNP in the last 8760 hours.  ProBNP (last 3 results) No results for input(s): PROBNP in the last 8760 hours.  Radiological Exams: Dg Chest Port 1 View  Result Date: 04/29/2019 CLINICAL DATA:  Pneumonia. EXAM: PORTABLE CHEST 1 VIEW COMPARISON:  Radiograph of April 22, 2019. FINDINGS: Stable cardiomediastinal silhouette. Tracheostomy tube is unchanged in position. Stable bilateral lung opacities are noted concerning for possible pneumonia. Bony thorax unremarkable. IMPRESSION: Stable bilateral lung opacities as described above. Electronically Signed   By: Marijo Conception M.D.   On: 04/29/2019 07:22    Assessment/Plan Active Problems:   Acute on chronic respiratory failure with hypoxia (HCC)   COVID-19 virus infection   Acute respiratory distress syndrome (ARDS) due to 2019 novel coronavirus (HCC)   Severe sepsis ([place])   1. Acute on chronic respiratory failure with hypoxiadoing well at this time weaning. Continue aggressive pulmonary toilet supportive measures. 2. COVID-19 virus infection in resolution phase we will continue with supportive care 3. ARDS treated we will continue to follow along. 4. Severe sepsis hemodynamics are stable   I have personally seen and evaluated the patient, evaluated laboratory and imaging results, formulated the assessment and plan and placed orders. The Patient requires high complexity decision making for assessment and support.  Case was discussed on Rounds with the Respiratory Therapy Staff  Allyne Gee, MD [facility] Pulmonary Critical Care Medicine Sleep Medicine

## 2019-05-01 DIAGNOSIS — A419 Sepsis, unspecified organism: Secondary | ICD-10-CM | POA: Diagnosis not present

## 2019-05-01 DIAGNOSIS — J80 Acute respiratory distress syndrome: Secondary | ICD-10-CM | POA: Diagnosis not present

## 2019-05-01 DIAGNOSIS — J9621 Acute and chronic respiratory failure with hypoxia: Secondary | ICD-10-CM | POA: Diagnosis not present

## 2019-05-01 DIAGNOSIS — U071 COVID-19: Secondary | ICD-10-CM | POA: Diagnosis not present

## 2019-05-01 LAB — BASIC METABOLIC PANEL
Anion gap: 13 (ref 5–15)
BUN: 21 mg/dL — ABNORMAL HIGH (ref 6–20)
CO2: 35 mmol/L — ABNORMAL HIGH (ref 22–32)
Calcium: 10.1 mg/dL (ref 8.9–10.3)
Chloride: 88 mmol/L — ABNORMAL LOW (ref 98–111)
Creatinine, Ser: 0.59 mg/dL (ref 0.44–1.00)
GFR calc Af Amer: >60 mL/min (ref >60)
GFR calc non Af Amer: >60 mL/min (ref >60)
Glucose, Bld: 217 mg/dL — ABNORMAL HIGH (ref 70–99)
Potassium: 4.8 mmol/L (ref 3.5–5.1)
Sodium: 136 mmol/L (ref 135–145)

## 2019-05-01 LAB — URINE CULTURE: Culture: 30000 — AB

## 2019-05-01 LAB — CARBAPENEM RESISTANCE PANEL
Carba Resistance IMP Gene: NOT DETECTED
Carba Resistance KPC Gene: DETECTED — AB
Carba Resistance NDM Gene: NOT DETECTED
Carba Resistance OXA48 Gene: NOT DETECTED
Carba Resistance VIM Gene: NOT DETECTED

## 2019-05-01 LAB — CULTURE, RESPIRATORY W GRAM STAIN: Gram Stain: NONE SEEN

## 2019-05-01 LAB — CBC
HCT: 36.9 % (ref 36.0–46.0)
Hemoglobin: 12 g/dL (ref 12.0–15.0)
MCH: 29.6 pg (ref 26.0–34.0)
MCHC: 32.5 g/dL (ref 30.0–36.0)
MCV: 90.9 fL (ref 80.0–100.0)
Platelets: 356 10*3/uL (ref 150–400)
RBC: 4.06 MIL/uL (ref 3.87–5.11)
RDW: 17.1 % — ABNORMAL HIGH (ref 11.5–15.5)
WBC: 8 10*3/uL (ref 4.0–10.5)
nRBC: 0 % (ref 0.0–0.2)

## 2019-05-01 LAB — MAGNESIUM: Magnesium: 2.1 mg/dL (ref 1.7–2.4)

## 2019-05-01 NOTE — Progress Notes (Addendum)
Pulmonary Critical Care Medicine [place]   PULMONARY CRITICAL CARE SERVICE  PROGRESS NOTE  Date of Service: 05/01/2019  Pamela Beasley  VCB:[id number]  DOB: 1965/12/04   DOA: 04/10/2019  Referring Physician: Merton Border, MD  HPI: Pamela Beasley is a 53 y.o. female seen for follow up of Acute on Chronic Respiratory Failure.  Patient remains capped for 3 days on 2 L nasal cannula.  Medications: Reviewed on Rounds  Physical Exam:  Vitals: Pulse 86 respiration 30 BP 140/84 O2 sat 90% temp 97.1  Ventilator Settings 2 L  . General: Comfortable at this time . Eyes: Grossly normal lids, irises & conjunctiva . ENT: grossly tongue is normal . Neck: no obvious mass . Cardiovascular: S1 S2 normal no gallop . Respiratory: No rales or rhonchi noted . Abdomen: soft . Skin: no rash seen on limited exam . Musculoskeletal: not rigid . Psychiatric:unable to assess . Neurologic: no seizure no involuntary movements         Lab Data:   Basic Metabolic Panel: Recent Labs  Lab 04/25/19 0653 04/28/19 1033 04/29/19 0634 05/01/19 0424  NA 140 140  --  136  K 3.6 3.9  --  4.8  CL 99 98  --  88*  CO2 30 31  --  35*  GLUCOSE 128* 134*  --  217*  BUN 21* 13  --  21*  CREATININE 0.55 0.45  --  0.59  CALCIUM 9.5 9.6  --  10.1  MG 1.7 1.7 1.9 2.1  PHOS 4.6 5.4*  --   --     ABG: No results for input(s): PHART, PCO2ART, PO2ART, HCO3, O2SAT in the last 168 hours.  Liver Function Tests: No results for input(s): AST, ALT, ALKPHOS, BILITOT, PROT, ALBUMIN in the last 168 hours. No results for input(s): LIPASE, AMYLASE in the last 168 hours. No results for input(s): AMMONIA in the last 168 hours.  CBC: Recent Labs  Lab 04/25/19 0653 04/28/19 1033 05/01/19 0424  WBC 11.7* 13.2* 8.0  HGB 9.5* 10.9* 12.0  HCT 30.6* 34.6* 36.9  MCV 93.6 94.8 90.9  PLT 340 339 356    Cardiac Enzymes: No results for input(s): CKTOTAL, CKMB, CKMBINDEX, TROPONINI in  the last 168 hours.  BNP (last 3 results) No results for input(s): BNP in the last 8760 hours.  ProBNP (last 3 results) No results for input(s): PROBNP in the last 8760 hours.  Radiological Exams: No results found.  Assessment/Plan Active Problems:   Acute on chronic respiratory failure with hypoxia (HCC)   COVID-19 virus infection   Acute respiratory distress syndrome (ARDS) due to 2019 novel coronavirus (HCC)   Severe sepsis ([place])   1. Acute on chronic respiratory failure with hypoxiaPT will continue on 2liters of oxygen.  Continue supportive measures at this time. 2. COVID-19 virus infection in resolution phase we will continue with supportive care 3. ARDS treated we will continue to follow along. 4. Severe sepsis hemodynamics are stable   I have personally seen and evaluated the patient, evaluated laboratory and imaging results, formulated the assessment and plan and placed orders. The Patient requires high complexity decision making for assessment and support.  Case was discussed on Rounds with the Respiratory Therapy Staff  Allyne Gee, MD [facility] Pulmonary Critical Care Medicine Sleep Medicine

## 2019-05-02 DIAGNOSIS — J9621 Acute and chronic respiratory failure with hypoxia: Secondary | ICD-10-CM | POA: Diagnosis not present

## 2019-05-02 DIAGNOSIS — A419 Sepsis, unspecified organism: Secondary | ICD-10-CM | POA: Diagnosis not present

## 2019-05-02 DIAGNOSIS — U071 COVID-19: Secondary | ICD-10-CM | POA: Diagnosis not present

## 2019-05-02 DIAGNOSIS — J80 Acute respiratory distress syndrome: Secondary | ICD-10-CM | POA: Diagnosis not present

## 2019-05-02 NOTE — Progress Notes (Addendum)
Pulmonary Critical Care Medicine [place]   PULMONARY CRITICAL CARE SERVICE  PROGRESS NOTE  Date of Service: 05/02/2019  Pamela Beasley  GMW:[id number]  DOB: Dec 05, 1965   DOA: 04/10/2019  Referring Physician: Merton Border, MD  HPI: Pamela Beasley is a 53 y.o. female seen for follow up of Acute on Chronic Respiratory Failure.  Patient remains on 2 L of oxygen via nasal cannula however was decannulated today and doing well.  Medications: Reviewed on Rounds  Physical Exam:  Vitals: Pulse 91 respiration 22 BP 191%  Ventilator Settings 2 L  . General: Comfortable at this time . Eyes: Grossly normal lids, irises & conjunctiva . ENT: grossly tongue is normal . Neck: no obvious mass . Cardiovascular: S1 S2 normal no gallop . Respiratory: No rales or rhonchi noted . Abdomen: soft . Skin: no rash seen on limited exam . Musculoskeletal: not rigid . Psychiatric:unable to assess . Neurologic: no seizure no involuntary movements         Lab Data:   Basic Metabolic Panel: Recent Labs  Lab 04/28/19 1033 04/29/19 0634 05/01/19 0424  NA 140  --  136  K 3.9  --  4.8  CL 98  --  88*  CO2 31  --  35*  GLUCOSE 134*  --  217*  BUN 13  --  21*  CREATININE 0.45  --  0.59  CALCIUM 9.6  --  10.1  MG 1.7 1.9 2.1  PHOS 5.4*  --   --     ABG: No results for input(s): PHART, PCO2ART, PO2ART, HCO3, O2SAT in the last 168 hours.  Liver Function Tests: No results for input(s): AST, ALT, ALKPHOS, BILITOT, PROT, ALBUMIN in the last 168 hours. No results for input(s): LIPASE, AMYLASE in the last 168 hours. No results for input(s): AMMONIA in the last 168 hours.  CBC: Recent Labs  Lab 04/28/19 1033 05/01/19 0424  WBC 13.2* 8.0  HGB 10.9* 12.0  HCT 34.6* 36.9  MCV 94.8 90.9  PLT 339 356    Cardiac Enzymes: No results for input(s): CKTOTAL, CKMB, CKMBINDEX, TROPONINI in the last 168 hours.  BNP (last 3 results) No results for input(s): BNP in  the last 8760 hours.  ProBNP (last 3 results) No results for input(s): PROBNP in the last 8760 hours.  Radiological Exams: No results found.  Assessment/Plan Active Problems:   Acute on chronic respiratory failure with hypoxia (HCC)   COVID-19 virus infection   Acute respiratory distress syndrome (ARDS) due to 2019 novel coronavirus (HCC)   Severe sepsis ([place])   1. Acute on chronic respiratory failure with hypoxiapatient was decannulated today satting well on 2 L we will continue supportive measure pulmonary toilet at this time. 2. COVID-19 virus infection in resolution phase we will continue with supportive care 3. ARDS treated we will continue to follow along. 4. Severe sepsis hemodynamics are stable   I have personally seen and evaluated the patient, evaluated laboratory and imaging results, formulated the assessment and plan and placed orders. The Patient requires high complexity decision making for assessment and support.  Case was discussed on Rounds with the Respiratory Therapy Staff  Allyne Gee, MD [facility] Pulmonary Critical Care Medicine Sleep Medicine

## 2019-05-03 ENCOUNTER — Other Ambulatory Visit (HOSPITAL_COMMUNITY): Payer: BC Managed Care – PPO

## 2019-05-03 DIAGNOSIS — J80 Acute respiratory distress syndrome: Secondary | ICD-10-CM | POA: Diagnosis not present

## 2019-05-03 DIAGNOSIS — A419 Sepsis, unspecified organism: Secondary | ICD-10-CM | POA: Diagnosis not present

## 2019-05-03 DIAGNOSIS — U071 COVID-19: Secondary | ICD-10-CM | POA: Diagnosis not present

## 2019-05-03 DIAGNOSIS — J9621 Acute and chronic respiratory failure with hypoxia: Secondary | ICD-10-CM | POA: Diagnosis not present

## 2019-05-03 NOTE — Progress Notes (Addendum)
Pulmonary Critical Care Medicine [place]   PULMONARY CRITICAL CARE SERVICE  PROGRESS NOTE  Date of Service: 05/03/2019  Pamela Beasley  VHQ:[id number]  DOB: 1965-09-23   DOA: 04/10/2019  Referring Physician: Merton Border, MD  HPI: Pamela Beasley is a 53 y.o. female seen for follow up of Acute on Chronic Respiratory Failure.  Medications: Reviewed on Rounds  Physical Exam:  Vitals: Pulse 74, Resp 18, 139/79, o2 sat 99%, temp 98.4  Ventilator Settings 2 Liters [place]  . General: Comfortable at this time . Eyes: Grossly normal lids, irises & conjunctiva . ENT: grossly tongue is normal . Neck: no obvious mass . Cardiovascular: S1 S2 normal no gallop . Respiratory: No rales or ronchi noted . Abdomen: soft . Skin: no rash seen on limited exam . Musculoskeletal: not rigid . Psychiatric:unable to assess . Neurologic: no seizure no involuntary movements         Lab Data:   Basic Metabolic Panel: Recent Labs  Lab 04/28/19 1033 04/29/19 0634 05/01/19 0424  NA 140  --  136  K 3.9  --  4.8  CL 98  --  88*  CO2 31  --  35*  GLUCOSE 134*  --  217*  BUN 13  --  21*  CREATININE 0.45  --  0.59  CALCIUM 9.6  --  10.1  MG 1.7 1.9 2.1  PHOS 5.4*  --   --     ABG: No results for input(s): PHART, PCO2ART, PO2ART, HCO3, O2SAT in the last 168 hours.  Liver Function Tests: No results for input(s): AST, ALT, ALKPHOS, BILITOT, PROT, ALBUMIN in the last 168 hours. No results for input(s): LIPASE, AMYLASE in the last 168 hours. No results for input(s): AMMONIA in the last 168 hours.  CBC: Recent Labs  Lab 04/28/19 1033 05/01/19 0424  WBC 13.2* 8.0  HGB 10.9* 12.0  HCT 34.6* 36.9  MCV 94.8 90.9  PLT 339 356    Cardiac Enzymes: No results for input(s): CKTOTAL, CKMB, CKMBINDEX, TROPONINI in the last 168 hours.  BNP (last 3 results) No results for input(s): BNP in the last 8760 hours.  ProBNP (last 3 results) No results for input(s):  PROBNP in the last 8760 hours.  Radiological Exams: No results found.  Assessment/Plan Active Problems:   Acute on chronic respiratory failure with hypoxia (HCC)   COVID-19 virus infection   Acute respiratory distress syndrome (ARDS) due to 2019 novel coronavirus (HCC)   Severe sepsis ([place])   1. Acute on chronic respiratory failure with hypoxiaPt remains decannulated.  Doing well on 2 liters of oxygen via [place]. Continue supportive measures, and pulmonary toilet.  2. COVID-19 virus infection in resolution phase we will continue with supportive care 3. ARDS treated we will continue to follow along. 4. Severe sepsis hemodynamics are stable   I have personally seen and evaluated the patient, evaluated laboratory and imaging results, formulated the assessment and plan and placed orders. The Patient requires high complexity decision making for assessment and support.  Case was discussed on Rounds with the Respiratory Therapy Staff  Allyne Gee, MD [facility] Pulmonary Critical Care Medicine Sleep Medicine

## 2019-05-04 LAB — BASIC METABOLIC PANEL
Anion gap: 14 (ref 5–15)
BUN: 24 mg/dL — ABNORMAL HIGH (ref 6–20)
CO2: 26 mmol/L (ref 22–32)
Calcium: 9.7 mg/dL (ref 8.9–10.3)
Chloride: 94 mmol/L — ABNORMAL LOW (ref 98–111)
Creatinine, Ser: 0.61 mg/dL (ref 0.44–1.00)
GFR calc Af Amer: >60 mL/min (ref >60)
GFR calc non Af Amer: >60 mL/min (ref >60)
Glucose, Bld: 229 mg/dL — ABNORMAL HIGH (ref 70–99)
Potassium: 4.2 mmol/L (ref 3.5–5.1)
Sodium: 134 mmol/L — ABNORMAL LOW (ref 135–145)

## 2019-05-04 LAB — CBC
HCT: 36.9 % (ref 36.0–46.0)
Hemoglobin: 11.9 g/dL — ABNORMAL LOW (ref 12.0–15.0)
MCH: 29.3 pg (ref 26.0–34.0)
MCHC: 32.2 g/dL (ref 30.0–36.0)
MCV: 90.9 fL (ref 80.0–100.0)
Platelets: 379 10*3/uL (ref 150–400)
RBC: 4.06 MIL/uL (ref 3.87–5.11)
RDW: 16.8 % — ABNORMAL HIGH (ref 11.5–15.5)
WBC: 10.9 10*3/uL — ABNORMAL HIGH (ref 4.0–10.5)
nRBC: 0 % (ref 0.0–0.2)

## 2019-05-04 LAB — MAGNESIUM: Magnesium: 1.9 mg/dL (ref 1.7–2.4)

## 2019-05-06 LAB — MAGNESIUM: Magnesium: 2 mg/dL (ref 1.7–2.4)

## 2019-05-06 LAB — CBC
HCT: 38 % (ref 36.0–46.0)
Hemoglobin: 12.3 g/dL (ref 12.0–15.0)
MCH: 29.1 pg (ref 26.0–34.0)
MCHC: 32.4 g/dL (ref 30.0–36.0)
MCV: 89.8 fL (ref 80.0–100.0)
Platelets: 409 10*3/uL — ABNORMAL HIGH (ref 150–400)
RBC: 4.23 MIL/uL (ref 3.87–5.11)
RDW: 17.5 % — ABNORMAL HIGH (ref 11.5–15.5)
WBC: 16 10*3/uL — ABNORMAL HIGH (ref 4.0–10.5)
nRBC: 0 % (ref 0.0–0.2)

## 2019-05-06 LAB — URINALYSIS, ROUTINE W REFLEX MICROSCOPIC
Bacteria, UA: NONE SEEN
Bilirubin Urine: NEGATIVE
Glucose, UA: >=500 mg/dL — AB
Hgb urine dipstick: NEGATIVE
Ketones, ur: NEGATIVE mg/dL
Leukocytes,Ua: NEGATIVE
Nitrite: NEGATIVE
Protein, ur: NEGATIVE mg/dL
Specific Gravity, Urine: 1.013 (ref 1.005–1.030)
pH: 7 (ref 5.0–8.0)

## 2019-05-06 LAB — BASIC METABOLIC PANEL
Anion gap: 10 (ref 5–15)
BUN: 16 mg/dL (ref 6–20)
CO2: 25 mmol/L (ref 22–32)
Calcium: 9.1 mg/dL (ref 8.9–10.3)
Chloride: 104 mmol/L (ref 98–111)
Creatinine, Ser: 0.48 mg/dL (ref 0.44–1.00)
GFR calc Af Amer: >60 mL/min (ref >60)
GFR calc non Af Amer: >60 mL/min (ref >60)
Glucose, Bld: 97 mg/dL (ref 70–99)
Potassium: 3.8 mmol/L (ref 3.5–5.1)
Sodium: 139 mmol/L (ref 135–145)

## 2019-05-07 LAB — NOVEL CORONAVIRUS, NAA (HOSP ORDER, SEND-OUT TO REF LAB; TAT 18-24 HRS): SARS-CoV-2, NAA: NOT DETECTED

## 2019-05-07 LAB — URINE CULTURE: Culture: <10000 — AB

## 2019-07-01 ENCOUNTER — Encounter: Payer: Self-pay | Admitting: Pulmonary Disease

## 2019-07-01 ENCOUNTER — Other Ambulatory Visit: Payer: Self-pay | Admitting: Pulmonary Disease

## 2019-07-01 ENCOUNTER — Ambulatory Visit (INDEPENDENT_AMBULATORY_CARE_PROVIDER_SITE_OTHER): Payer: BC Managed Care – PPO | Admitting: Pulmonary Disease

## 2019-07-01 ENCOUNTER — Ambulatory Visit (INDEPENDENT_AMBULATORY_CARE_PROVIDER_SITE_OTHER)
Admission: RE | Admit: 2019-07-01 | Discharge: 2019-07-01 | Disposition: A | Discharge status: Home / Self Care | Payer: BC Managed Care – PPO | Source: Ambulatory Visit | Attending: Pulmonary Disease | Admitting: Pulmonary Disease

## 2019-07-01 ENCOUNTER — Other Ambulatory Visit: Payer: Self-pay

## 2019-07-01 ENCOUNTER — Telehealth: Payer: Self-pay | Admitting: Pulmonary Disease

## 2019-07-01 VITALS — BP 122/70 | HR 124 | Temp 97.1°F | Ht 64.0 in | Wt 212.4 lb

## 2019-07-01 DIAGNOSIS — R0609 Other forms of dyspnea: Secondary | ICD-10-CM | POA: Diagnosis not present

## 2019-07-01 DIAGNOSIS — R000 Tachycardia, unspecified: Secondary | ICD-10-CM | POA: Diagnosis not present

## 2019-07-01 DIAGNOSIS — R0602 Shortness of breath: Secondary | ICD-10-CM | POA: Diagnosis not present

## 2019-07-01 HISTORY — DX: Type 2 diabetes mellitus without complications: E11.9

## 2019-07-01 MED ORDER — BREO ELLIPTA 100-25 MCG/INH IN AEPB: 1.0000 | INHALATION_SPRAY | Freq: Every day | RESPIRATORY_TRACT | 5 refills | Status: AC

## 2019-07-01 MED ORDER — IOHEXOL 350 MG/ML SOLN
80.0000 mL | Freq: Once | INTRAVENOUS | Status: AC | PRN
  Administered 2019-07-01: 80 mL via INTRAVENOUS

## 2019-07-01 NOTE — Addendum Note (Signed)
Addended by: Vivia Ewing on: 07/01/2019 01:21 PM   Modules accepted: Orders

## 2019-07-01 NOTE — Patient Instructions (Signed)
Recent Covid infection Recovery from sepsis  Hypoxemic respiratory failure, requiring oxygen supplementation  Continue Breo 100, use once a day  Obtain echocardiogram to assess cardiac function  Regular exercises  It will still take you some time to recover adequate muscle function  At some point but we will get a breathing study to check your lung functions   Regular exercise-as tolerated, multiple times a day

## 2019-07-01 NOTE — Telephone Encounter (Signed)
Pre-cert Verification for the following procedure    Echo scheduled for 07-08-2019 at [facility]

## 2019-07-01 NOTE — Progress Notes (Addendum)
Subjective:    Patient ID: Pamela Beasley, female    DOB: 11-Sep-1965, 53 y.o.   MRN: [id number]  Patient was treated for Covid pneumonia/sepsis/respiratory failure  Patient was admitted to the hospital treated for Covid pneumonia, ARDS, encephalopathy-was in the hospital for a month [facility] 3 weeks She was successfully weaned and decannulated Discharged on oxygen supplementation  Gradually activity levels have improved Still shortness of breath with exertion  Still spasms of cough and shortness of breath  Able to tolerate minimal steps recently  She does have follow-up with physical therapy twice a week  Prior to contracting Covid did not have any respiratory problems that was chronic, she had had history of bronchitis  Never smoker No prior history of asthma  Past Medical History:  Diagnosis Date  . Acute on chronic respiratory failure with hypoxia (HCC)   . Acute respiratory distress syndrome (ARDS) due to 2019 novel coronavirus (HCC)   . COVID-19 virus infection   . Severe sepsis (HCC)    No family history on file. Social History   Socioeconomic History  . Marital status: Married    Spouse name: Not on file  . Number of children: Not on file  . Years of education: Not on file  . Highest education level: Not on file  Occupational History  . Not on file  Social Needs  . Financial resource strain: Not on file  . Food insecurity    Worry: Not on file    Inability: Not on file  . Transportation needs    Medical: Not on file    Non-medical: Not on file  Tobacco Use  . Smoking status: Not on file  . Smokeless tobacco: Never Used  Substance and Sexual Activity  . Alcohol use: Not on file  . Drug use: Not on file  . Sexual activity: Not on file  Lifestyle  . Physical activity    Days per week: Not on file    Minutes per session: Not on file  . Stress: Not on file  Relationships  . Social Manufacturing systems engineer on phone: Not on file    Gets together: Not on file    Attends religious service: Not on file    Active member of club or organization: Not on file    Attends meetings of clubs or organizations: Not on file    Relationship status: Not on file  . Intimate partner violence    Fear of current or ex partner: Not on file    Emotionally abused: Not on file    Physically abused: Not on file    Forced sexual activity: Not on file  Other Topics Concern  . Not on file  Social History Narrative  . Not on file    Review of Systems  Constitutional: Negative for fever and unexpected weight change.  HENT: Positive for congestion, nosebleeds, postnasal drip, rhinorrhea, sinus pressure, sneezing and sore throat. Negative for dental problem and trouble swallowing.   Eyes: Negative for redness and itching.  Respiratory: Positive for cough, chest tightness, shortness of breath and wheezing.   Cardiovascular: Positive for palpitations and leg swelling.  Gastrointestinal: Negative for nausea and vomiting.  Genitourinary: Negative for dysuria.  Musculoskeletal: Positive for joint swelling.  Skin: Negative for rash.  Allergic/Immunologic: Negative.  Negative for environmental allergies, food allergies and immunocompromised state.  Neurological: Positive for headaches.  Hematological: Does not bruise/bleed easily.  Psychiatric/Behavioral: Negative for dysphoric mood. The  patient is not nervous/anxious.   All other systems reviewed and are negative.      Objective:   Physical Exam Constitutional:      Appearance: Normal appearance.  HENT:     Head: Normocephalic and atraumatic.     Nose: Nose normal. No congestion or rhinorrhea.  Eyes:     General:        Right eye: No discharge.        Left eye: No discharge.     Pupils: Pupils are equal, round, and reactive to light.  Neck:     Musculoskeletal: Normal range of motion and neck supple. No neck rigidity.  Cardiovascular:     Rate and  Rhythm: Normal rate and regular rhythm.     Pulses: Normal pulses.     Heart sounds: No murmur.  Pulmonary:     Effort: Pulmonary effort is normal. No respiratory distress.     Breath sounds: Normal breath sounds. No stridor. No wheezing or rhonchi.  Musculoskeletal: Normal range of motion.     Comments: Wheelchair borne  Neurological:     General: No focal deficit present.     Mental Status: She is alert.  Psychiatric:        Mood and Affect: Mood normal.    Last chest x-ray on record did show basal infiltrative process-reviewed with patient and sister     Assessment & Plan:  .  Chronic hypoxic respiratory failure -Related to recent Covid pneumonia infection -Recent ARDS  .  Deconditioning -Protracted hospitalization  .  Hypoxic respiratory failure -This should gradually improve as she does not have underlying lung disease -However difficult to be sure where things will settle  Plan: Encouraged to continue using Breo 100 Prescription was provided-May have some component of airway hyperactivity she was having coughing spells with deep breathing -Not a good candidate for pulmonary function test at present  -Inhaler technique reviewed  Importance of regular exercise and graded exercise training discussed  Continue oxygen supplementation at present  I will see her back in the office in about 6 to 8 weeks  Echocardiogram ordered   Resting tachycardia, dyspnea on exertion Desaturations at rest concerning  Order CT PE angio

## 2019-07-08 ENCOUNTER — Other Ambulatory Visit (HOSPITAL_COMMUNITY): Payer: BC Managed Care – PPO

## 2019-07-11 ENCOUNTER — Other Ambulatory Visit: Payer: Self-pay

## 2019-07-11 ENCOUNTER — Ambulatory Visit (HOSPITAL_COMMUNITY)
Admission: RE | Admit: 2019-07-11 | Discharge: 2019-07-11 | Disposition: A | Discharge status: Home / Self Care | Payer: BC Managed Care – PPO | Source: Ambulatory Visit | Attending: Pulmonary Disease | Admitting: Pulmonary Disease

## 2019-07-11 DIAGNOSIS — R0609 Other forms of dyspnea: Secondary | ICD-10-CM

## 2019-07-11 NOTE — Progress Notes (Signed)
*  PRELIMINARY RESULTS* Echocardiogram 2D Echocardiogram has been performed.  Pamela Beasley 07/11/2019, 11:39 AM

## 2019-10-14 IMAGING — DX DG CHEST 1V PORT
1 series · 1 of 1 positions shown · non-contrast
Comparison: Radiograph April 22, 2019.

CLINICAL DATA: Pneumonia.

EXAM:
PORTABLE CHEST 1 VIEW

[chest ap]
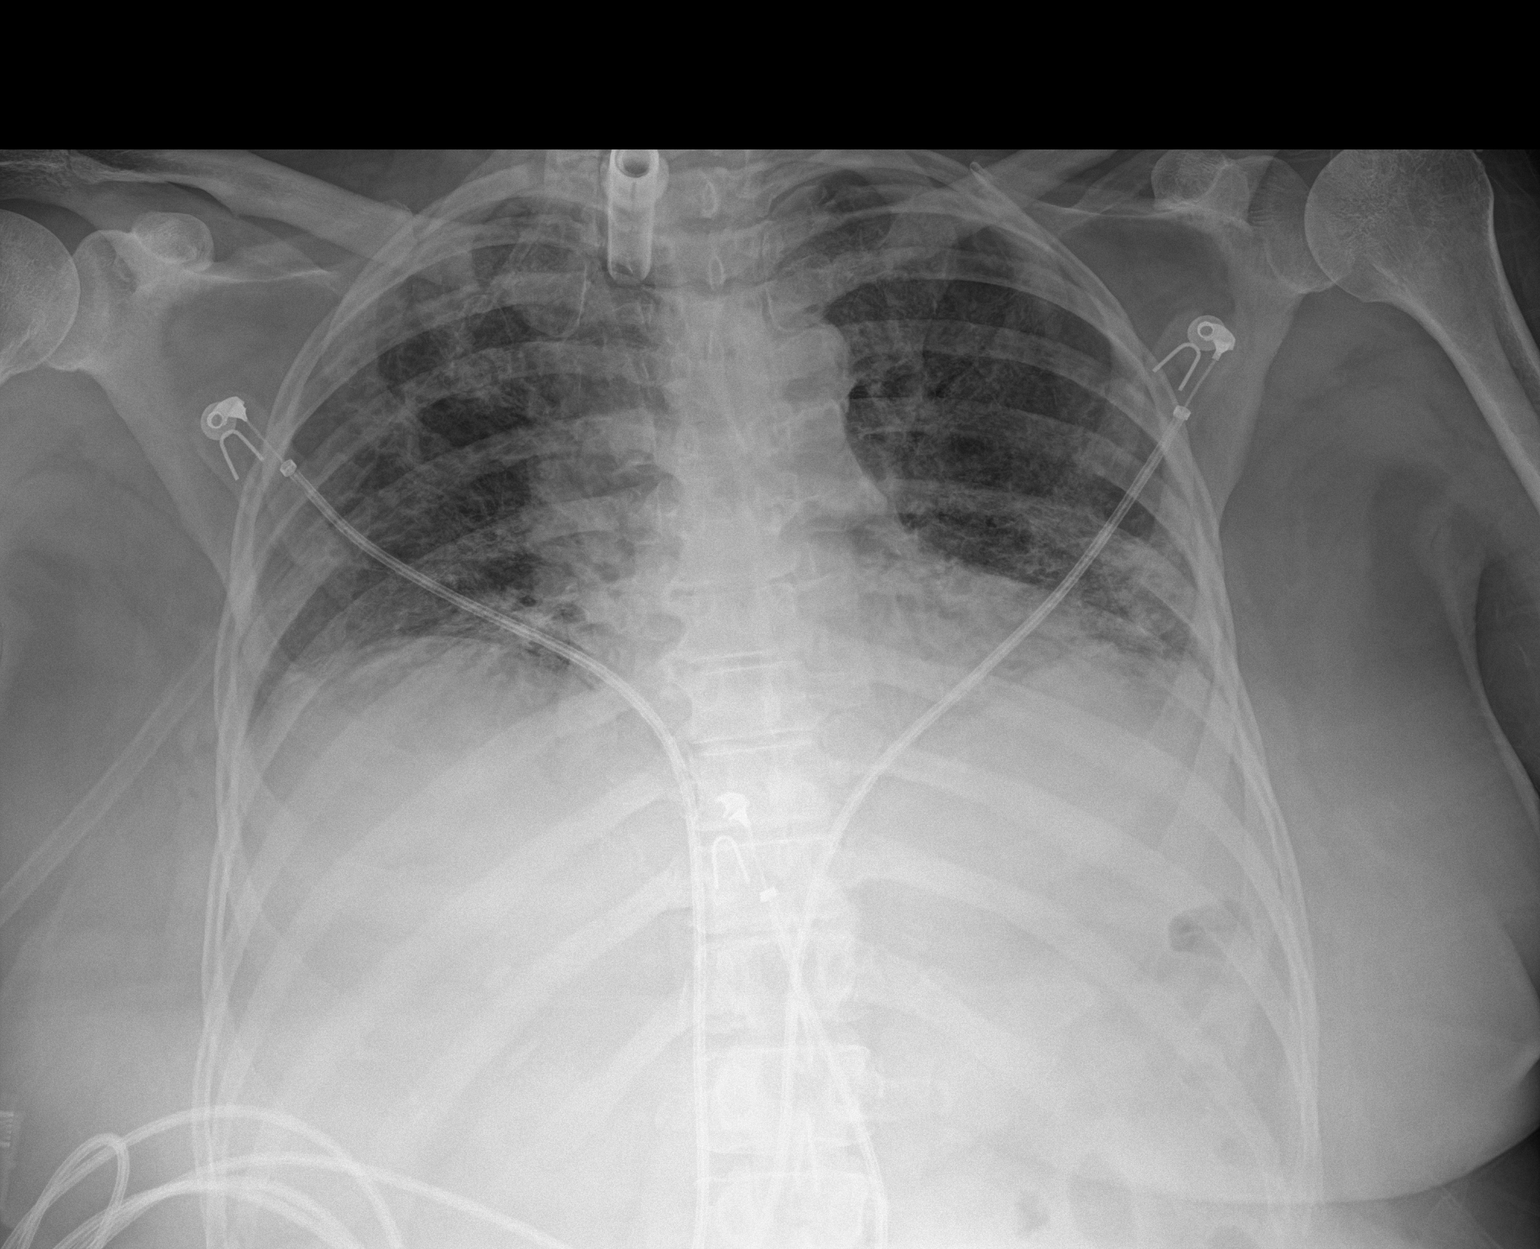

[1 of 1 positions shown; findings below may reference images not displayed]

FINDINGS: Stable cardiomediastinal silhouette. Tracheostomy tube is unchanged
in position. Stable bilateral lung opacities are noted concerning
for possible pneumonia. Bony thorax unremarkable.
IMPRESSION: Stable bilateral lung opacities as described above.

## 2019-12-27 ENCOUNTER — Other Ambulatory Visit: Payer: Self-pay | Admitting: Pulmonary Disease

## 2020-03-05 ENCOUNTER — Other Ambulatory Visit: Payer: Self-pay

## 2020-03-05 ENCOUNTER — Encounter: Payer: Self-pay | Admitting: Physician Assistant

## 2020-03-05 ENCOUNTER — Ambulatory Visit (INDEPENDENT_AMBULATORY_CARE_PROVIDER_SITE_OTHER): Payer: BC Managed Care – PPO | Admitting: Physician Assistant

## 2020-03-05 DIAGNOSIS — F418 Other specified anxiety disorders: Secondary | ICD-10-CM | POA: Insufficient documentation

## 2020-03-05 DIAGNOSIS — E785 Hyperlipidemia, unspecified: Secondary | ICD-10-CM | POA: Insufficient documentation

## 2020-03-05 DIAGNOSIS — IMO0002 Reserved for concepts with insufficient information to code with codable children: Secondary | ICD-10-CM | POA: Insufficient documentation

## 2020-03-05 DIAGNOSIS — E039 Hypothyroidism, unspecified: Secondary | ICD-10-CM | POA: Insufficient documentation

## 2020-03-05 DIAGNOSIS — L700 Acne vulgaris: Secondary | ICD-10-CM | POA: Diagnosis not present

## 2020-03-05 DIAGNOSIS — E1165 Type 2 diabetes mellitus with hyperglycemia: Secondary | ICD-10-CM | POA: Insufficient documentation

## 2020-03-05 DIAGNOSIS — I10 Essential (primary) hypertension: Secondary | ICD-10-CM | POA: Insufficient documentation

## 2020-03-05 MED ORDER — TRETINOIN 0.05 % EX CREA: TOPICAL_CREAM | Freq: Every evening | CUTANEOUS | 0 refills | Status: DC

## 2020-03-05 NOTE — Progress Notes (Signed)
   Follow-Up Visit   Subjective  Pamela Beasley is a 54 y.o. female who presents for the following: Skin Problem (on face).   The following portions of the chart were reviewed this encounter and updated as appropriate:     Objective  Well appearing patient in no apparent distress; mood and affect are within normal limits.  A focused examination was performed including face. Relevant physical exam findings are noted in the Assessment and Plan.  Objective  Left Malar Cheek, Right Malar Cheek: Erythematous papules and pustules with comedones   Assessment & Plan  Acne vulgaris (2) Left Malar Cheek; Right Malar Cheek  Ordered Medications: tretinoin (RETIN-A) 0.05 % cream    I, Emanii Bugbee, PA-C, have reviewed all documentation's for this visit.  The documentation on 03/05/20 for the exam, diagnosis, procedures and orders are all accurate and complete.

## 2020-03-10 ENCOUNTER — Telehealth: Payer: Self-pay

## 2020-03-10 NOTE — Telephone Encounter (Signed)
Rhoderick Moody Key: [id number] - PA Case ID: [id number] - Rx #: [id number] Need help? Call us at [phone] Status Additional Information Required Drug Tretinoin 0.05% cream Form Photographer PA Form (2017 NCPDP) Original Claim Info 75 SUBMIT PA REQUEST TO: [email or web address] [email or web address] PRIOR AUTHORIZATION

## 2020-03-10 NOTE — Telephone Encounter (Addendum)
Rhoderick Moody Key: [id number] - PA Case ID: [id number] - Rx #: [id number] Need help? Call us at [phone] Outcome Approvedtoday PA Case: [id number], Status: Approved, Coverage Starts on: 03/10/2020 12:00:00 AM, Coverage Ends on: 03/10/2021 12:00:00 AM. Drug Tretinoin 0.05% cream Form Photographer PA Form (2017 NCPDP) Original Claim Info 75 SUBMIT PA REQUEST TO: [email or web address] PRIOR AUTHORIZATION

## 2020-08-28 ENCOUNTER — Other Ambulatory Visit: Payer: Self-pay | Admitting: Pulmonary Disease

## 2020-08-28 ENCOUNTER — Other Ambulatory Visit: Payer: Self-pay | Admitting: Physician Assistant

## 2020-08-28 DIAGNOSIS — L700 Acne vulgaris: Secondary | ICD-10-CM

## 2020-08-31 ENCOUNTER — Telehealth: Payer: Self-pay

## 2020-08-31 NOTE — Telephone Encounter (Signed)
Started pa for tretinoin 0.05% for ance patient on covermymeds.  alazne quant (Key: [id number]) Rx #: [id number] Tretinoin 0.05% cream   Form [employer] Electronic Prior Authorization Form [phone] NCPDP)

## 2020-12-18 ENCOUNTER — Other Ambulatory Visit: Payer: Self-pay | Admitting: Dermatology

## 2020-12-18 DIAGNOSIS — L700 Acne vulgaris: Secondary | ICD-10-CM

## 2021-11-11 ENCOUNTER — Ambulatory Visit (INDEPENDENT_AMBULATORY_CARE_PROVIDER_SITE_OTHER): Payer: Medicare Other | Admitting: Physician Assistant

## 2021-11-11 ENCOUNTER — Encounter: Payer: Self-pay | Admitting: Physician Assistant

## 2021-11-11 ENCOUNTER — Encounter (INDEPENDENT_AMBULATORY_CARE_PROVIDER_SITE_OTHER): Payer: Self-pay

## 2021-11-11 DIAGNOSIS — L820 Inflamed seborrheic keratosis: Secondary | ICD-10-CM | POA: Diagnosis not present

## 2021-11-15 ENCOUNTER — Encounter: Payer: Self-pay | Admitting: Physician Assistant

## 2021-11-15 NOTE — Progress Notes (Signed)
? ?  Follow-Up Visit ?  ?Subjective  ?Pamela Beasley is a 56 y.o. female who presents for the following: Skin Problem (New lesion on face x months- dark and raised- just want checked). ? ? ?The following portions of the chart were reviewed this encounter and updated as appropriate:  Tobacco  Allergies  Meds  Problems  Med Hx  Surg Hx  Fam Hx   ?  ? ?Objective  ?Well appearing patient in no apparent distress; mood and affect are within normal limits. ? ?All skin waist up examined. ? ?Left Malar Cheek (2), Right Malar Cheek (4) ?Stuck-on, waxy, tan-brown papules and plaques. --Discussed benign etiology and prognosis.  ? ? ?Assessment & Plan  ?Seborrheic keratosis, inflamed (6) ?Left Malar Cheek (2); Right Malar Cheek (4) ? ?Destruction of lesion - Left Malar Cheek, Right Malar Cheek ?Complexity: simple   ?Destruction method: cryotherapy   ?Informed consent: discussed and consent obtained   ?Timeout:  patient name, date of birth, surgical site, and procedure verified ?Lesion destroyed using liquid nitrogen: Yes   ?Cryotherapy cycles:  3 ?Outcome: patient tolerated procedure well with no complications   ? ? ? ?I, Shevelle Smither, PA-C, have reviewed all documentation's for this visit.  The documentation on 11/15/21 for the exam, diagnosis, procedures and orders are all accurate and complete. ?

## 2022-05-17 ENCOUNTER — Ambulatory Visit: Payer: BC Managed Care – PPO | Admitting: Physician Assistant
